# Patient Record
Sex: Male | Born: 1950
Health system: Southern US, Community
[De-identification: ages and names within clinical notes are randomized; demographics above are authoritative.]

## PROBLEM LIST (undated history)

## (undated) DIAGNOSIS — F419 Anxiety disorder, unspecified: Secondary | ICD-10-CM

## (undated) DIAGNOSIS — E119 Type 2 diabetes mellitus without complications: Secondary | ICD-10-CM

## (undated) DIAGNOSIS — C61 Malignant neoplasm of prostate: Secondary | ICD-10-CM

## (undated) HISTORY — PX: PROSTATE BIOPSY: SHX241

## (undated) HISTORY — DX: Type 2 diabetes mellitus without complications: E11.9

## (undated) HISTORY — DX: Anxiety disorder, unspecified: F41.9

---

## 1992-12-29 HISTORY — PX: KNEE RECONSTRUCTION: SHX5883

## 2017-06-03 DIAGNOSIS — Z0000 Encounter for general adult medical examination without abnormal findings: Secondary | ICD-10-CM | POA: Diagnosis not present

## 2017-07-13 ENCOUNTER — Ambulatory Visit (INDEPENDENT_AMBULATORY_CARE_PROVIDER_SITE_OTHER): Payer: PPO | Admitting: Physician Assistant

## 2017-07-13 ENCOUNTER — Encounter: Payer: Self-pay | Admitting: Physician Assistant

## 2017-07-13 VITALS — BP 134/83 | HR 80 | Temp 98.6°F | Resp 18 | Ht 71.0 in | Wt 225.0 lb

## 2017-07-13 DIAGNOSIS — I10 Essential (primary) hypertension: Secondary | ICD-10-CM

## 2017-07-13 DIAGNOSIS — Z794 Long term (current) use of insulin: Secondary | ICD-10-CM | POA: Diagnosis not present

## 2017-07-13 DIAGNOSIS — K209 Esophagitis, unspecified without bleeding: Secondary | ICD-10-CM

## 2017-07-13 DIAGNOSIS — N529 Male erectile dysfunction, unspecified: Secondary | ICD-10-CM

## 2017-07-13 DIAGNOSIS — E1142 Type 2 diabetes mellitus with diabetic polyneuropathy: Secondary | ICD-10-CM

## 2017-07-13 DIAGNOSIS — Z8719 Personal history of other diseases of the digestive system: Secondary | ICD-10-CM

## 2017-07-13 DIAGNOSIS — E1165 Type 2 diabetes mellitus with hyperglycemia: Secondary | ICD-10-CM

## 2017-07-13 DIAGNOSIS — IMO0002 Reserved for concepts with insufficient information to code with codable children: Secondary | ICD-10-CM

## 2017-07-13 DIAGNOSIS — E7800 Pure hypercholesterolemia, unspecified: Secondary | ICD-10-CM | POA: Diagnosis not present

## 2017-07-13 MED ORDER — LIRAGLUTIDE 18 MG/3ML ~~LOC~~ SOPN: 1.8000 mg | PEN_INJECTOR | Freq: Every day | SUBCUTANEOUS | 2 refills | Status: DC

## 2017-07-13 MED ORDER — OMEPRAZOLE 40 MG PO CPDR: 40.0000 mg | DELAYED_RELEASE_CAPSULE | Freq: Two times a day (BID) | ORAL | 4 refills | Status: DC

## 2017-07-13 MED ORDER — LISINOPRIL 10 MG PO TABS: 10.0000 mg | ORAL_TABLET | Freq: Every day | ORAL | 3 refills | Status: DC

## 2017-07-13 MED ORDER — METFORMIN HCL 1000 MG PO TABS: ORAL_TABLET | ORAL | 4 refills | Status: DC

## 2017-07-13 NOTE — Progress Notes (Signed)
Andrew Roberson  MRN: [id number] DOB: 08-Mar-1951  PCP: Mancel Bale, PA-C  Chief Complaint  Patient presents with  . Diabetes  . Medication Refill    needs a refill on lantus, glimepiride and lisinopril     Subjective:  Pt presents to clinic to establish care and to have a follow up for his DM. He feels fine.  He moved to [place] several years ago to help his aging parents whom have within the last 9 months both passed away.  He does not check his sugar.  He had noticed that since he has been in [place] his DM control has worsened he thinks due to diet changes and stress as well as he has been and still is less active.  He is interested in increased activity and he wants to start walking.  He was on [place] and it worked well but his insurance changed and it would not cover it.  He has not been checking his glucose as home.  He takes his medications as directed.  He has had problems with erections in the past and continues to have those problems.  He has never had his testosterone checked and he has tried cialis and viagra without results.  2/18 - lab results A1C in 7.9 HDL 43 LDL 40  Review of Systems  Constitutional: Negative for chills and fever.  Respiratory: Negative for cough and shortness of breath.   Cardiovascular: Negative for chest pain, palpitations and leg swelling.  Genitourinary:       Impotence - trouble getting and maintaining erections  Neurological: Negative for numbness.    Patient Active Problem List   Diagnosis Date Noted  . Diabetes type 2, uncontrolled ([place]) 07/13/2017  . HTN (hypertension) 07/13/2017  . Elevated cholesterol 07/13/2017  . Esophagitis 07/13/2017  . History of esophageal stricture 07/13/2017    No current outpatient prescriptions on file prior to visit.   No current facility-administered medications on file prior to visit.     Allergies  Allergen Reactions  . Codeine     Other reaction(s): Abdominal Pain    Past Medical History:    Diagnosis Date  . Anxiety   . Diabetes mellitus without complication [facility])    Social History   Social History  . Marital status: Single    Spouse name: N/A  . Number of children: N/A  . Years of education: N/A   Occupational History  . retired    Social History Main Topics  . Smoking status: Former Smoker    Packs/day: 1.00    Years: 25.00    Quit date: 07/14/2007  . Smokeless tobacco: Never Used  . Alcohol use No     Comment: social rare  . Drug use: Yes    Types: Marijuana     Comment: social  . Sexual activity: No   Other Topics Concern  . 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   family history includes Dementia in his mother; Berenice Primas' disease in his sister; Hypertension in his father; Myelodysplastic syndrome in his father; Stroke in his mother.     Objective:  BP 134/83   Pulse 80   Temp 98.6 F (37 C) (Oral)   Resp 18   Ht _0  (1.803 m)   Wt 225 lb (102.1 kg)   SpO2 95%   BMI 31.38 kg/m   Physical Exam  Constitutional: He is oriented to person, place, and time and well-developed, well-nourished, and in no distress.  HENT:  Head: Normocephalic and atraumatic.  Right Ear: External ear normal.  Left Ear: External ear normal.  Eyes: Conjunctivae are normal.  Neck: Normal range of motion.  Cardiovascular: Normal rate, regular rhythm and normal heart sounds.   Pulmonary/Chest: Effort normal and breath sounds normal.  Neurological: He is alert and oriented to person, place, and time. Gait normal.  Skin: Skin is warm and dry.  Diabetic Foot Exam - Simple   Simple Foot Form Visual Inspection See comments:  Yes Sensation Testing Intact to touch and monofilament testing bilaterally:  Yes Pulse Check Posterior Tibialis and Dorsalis pulse intact bilaterally:  Yes Comments Has a cut on top right foot that is healing well  Psychiatric: Mood, memory, affect and  judgment normal.   Assessment and Plan :  Impotence - Plan: TestT+TestF+SHBG  Uncontrolled type 2 diabetes mellitus with diabetic polyneuropathy, with long-term current use of insulin (HCC) - Plan: CMP14+EGFR, Hemoglobin A1c, metFORMIN (GLUCOPHAGE) 1000 MG tablet, liraglutide 18 MG/3ML SOPN, HM DIABETES FOOT EXAM  Essential hypertension - Plan: lisinopril (PRINIVIL,ZESTRIL) 10 MG tablet  Elevated cholesterol - Plan: Lipid panel  Esophagitis - Plan: omeprazole (PRILOSEC) 40 MG capsule  History of esophageal stricture  Windell Hummingbird PA-C  Primary Care at [place] 07/14/2017 7:37 AM

## 2017-07-13 NOTE — Patient Instructions (Addendum)
     IF you received an x-ray today, you will receive an invoice from [facility]. Please contact [facility] at [phone] with questions or concerns regarding your invoice.   IF you received labwork today, you will receive an invoice from [place]. Please contact [facility] at [phone] with questions or concerns regarding your invoice.   Our billing staff will not be able to assist you with questions regarding bills from these companies.  You will be contacted with the lab results as soon as they are available. The fastest way to get your results is to activate your My Chart account. Instructions are located on the last page of this paperwork. If you have not heard from [place] regarding the results in 2 weeks, please contact this office.    Please sign up for mychart as it is a great way to get your labs and communicate with me.

## 2017-07-14 LAB — CMP14+EGFR
ALT: 16 IU/L (ref 0–44)
AST: 13 IU/L (ref 0–40)
Albumin/Globulin Ratio: 1.8 (ref 1.2–2.2)
Albumin: 4.6 g/dL (ref 3.6–4.8)
Alkaline Phosphatase: 96 IU/L (ref 39–117)
BUN/Creatinine Ratio: 16 (ref 10–24)
BUN: 16 mg/dL (ref 8–27)
Bilirubin Total: 0.3 mg/dL (ref 0.0–1.2)
CO2: 21 mmol/L (ref 20–29)
Calcium: 10.4 mg/dL — ABNORMAL HIGH (ref 8.6–10.2)
Chloride: 95 mmol/L — ABNORMAL LOW (ref 96–106)
Creatinine, Ser: 1 mg/dL (ref 0.76–1.27)
GFR calc Af Amer: 91 mL/min/{1.73_m2} (ref >59)
GFR calc non Af Amer: 79 mL/min/{1.73_m2} (ref >59)
Globulin, Total: 2.5 g/dL (ref 1.5–4.5)
Glucose: 160 mg/dL — ABNORMAL HIGH (ref 65–99)
Potassium: 5 mmol/L (ref 3.5–5.2)
Sodium: 136 mmol/L (ref 134–144)
Total Protein: 7.1 g/dL (ref 6.0–8.5)

## 2017-07-14 LAB — LIPID PANEL
Chol/HDL Ratio: 3.1 ratio (ref 0.0–5.0)
Cholesterol, Total: 147 mg/dL (ref 100–199)
HDL: 47 mg/dL (ref >39)
LDL Calculated: 69 mg/dL (ref 0–99)
Triglycerides: 153 mg/dL — ABNORMAL HIGH (ref 0–149)
VLDL Cholesterol Cal: 31 mg/dL (ref 5–40)

## 2017-07-14 LAB — HEMOGLOBIN A1C
Est. average glucose Bld gHb Est-mCnc: 212 mg/dL
Hgb A1c MFr Bld: 9 % — ABNORMAL HIGH (ref 4.8–5.6)

## 2017-07-17 ENCOUNTER — Encounter: Payer: Self-pay | Admitting: Physician Assistant

## 2017-07-20 DIAGNOSIS — Z85828 Personal history of other malignant neoplasm of skin: Secondary | ICD-10-CM | POA: Diagnosis not present

## 2017-07-20 DIAGNOSIS — D1801 Hemangioma of skin and subcutaneous tissue: Secondary | ICD-10-CM | POA: Diagnosis not present

## 2017-07-20 DIAGNOSIS — D2271 Melanocytic nevi of right lower limb, including hip: Secondary | ICD-10-CM | POA: Diagnosis not present

## 2017-07-20 DIAGNOSIS — D2262 Melanocytic nevi of left upper limb, including shoulder: Secondary | ICD-10-CM | POA: Diagnosis not present

## 2017-07-20 DIAGNOSIS — D2261 Melanocytic nevi of right upper limb, including shoulder: Secondary | ICD-10-CM | POA: Diagnosis not present

## 2017-07-20 DIAGNOSIS — L821 Other seborrheic keratosis: Secondary | ICD-10-CM | POA: Diagnosis not present

## 2017-07-21 LAB — TESTT+TESTF+SHBG
Sex Hormone Binding: 40.2 nmol/L (ref 19.3–76.4)
Testosterone, Free: 9.4 pg/mL (ref 6.6–18.1)
Testosterone, total: 530.5 ng/dL (ref 264.0–916.0)

## 2017-09-18 ENCOUNTER — Ambulatory Visit (INDEPENDENT_AMBULATORY_CARE_PROVIDER_SITE_OTHER): Payer: PPO | Admitting: Physician Assistant

## 2017-09-18 ENCOUNTER — Encounter: Payer: Self-pay | Admitting: Physician Assistant

## 2017-09-18 VITALS — BP 113/76 | HR 80 | Temp 98.3°F | Resp 18 | Ht 70.71 in | Wt 217.6 lb

## 2017-09-18 DIAGNOSIS — Z23 Encounter for immunization: Secondary | ICD-10-CM | POA: Diagnosis not present

## 2017-09-18 DIAGNOSIS — S61112A Laceration without foreign body of left thumb with damage to nail, initial encounter: Secondary | ICD-10-CM

## 2017-09-18 NOTE — Patient Instructions (Addendum)
WOUND CARE Please return in 10-14 days to have your stitches/staples removed or sooner if you have concerns. Marland Kitchen Keep area clean and dry for 24 hours. Do not remove bandage, if applied. . After 24 hours, remove bandage and wash wound gently with mild soap and warm water. Reapply a new bandage after cleaning wound, if directed. . Continue daily cleansing with soap and water until stitches/staples are removed. . Do not apply any ointments or creams to the wound while stitches/staples are in place, as this may cause delayed healing. . Notify the office if you experience any of the following signs of infection: Swelling, redness, pus drainage, streaking, fever >101.0 F . Notify the office if you experience excessive bleeding that does not stop after 15-20 minutes of constant, firm pressure.     IF you received an x-ray today, you will receive an invoice from [facility]. Please contact [facility] at [phone] with questions or concerns regarding your invoice.   IF you received labwork today, you will receive an invoice from [place]. Please contact [facility] at [phone] with questions or concerns regarding your invoice.   Our billing staff will not be able to assist you with questions regarding bills from these companies.  You will be contacted with the lab results as soon as they are available. The fastest way to get your results is to activate your My Chart account. Instructions are located on the last page of this paperwork. If you have not heard from [place] regarding the results in 2 weeks, please contact this office.

## 2017-09-18 NOTE — Progress Notes (Signed)
Andrew Roberson  MRN: [id number] DOB: 1951/07/24  Subjective:  Andrew Roberson is a 66 y.o. male seen in office today for a chief complaint of cut to left tip of thumb x 1 hour prior to arrival. He was cutting food with a clean knife and it slipped and cut his thumb. He cleaned it out immediately. He had associated bleeding and pain. Denies numbness, tingling, and decreased ROM. He cannot recall if he is up to date on tdap vaccine.   Review of Systems  Constitutional: Negative for chills, diaphoresis and fever.  Gastrointestinal: Negative for nausea and vomiting.  Neurological: Negative for dizziness and light-headedness.    Patient Active Problem List   Diagnosis Date Noted  . Diabetes type 2, uncontrolled ([place]) 07/13/2017  . HTN (hypertension) 07/13/2017  . Elevated cholesterol 07/13/2017  . Esophagitis 07/13/2017  . History of esophageal stricture 07/13/2017    Current Outpatient Prescriptions on File Prior to Visit  Medication Sig Dispense Refill  . ALPRAZolam (XANAX) 0.25 MG tablet TAKE 1 TABLET BY MOUTH AT BEDTIME AS NEEDED FOR SLEEP    . aspirin 81 MG chewable tablet Chew by mouth.    . Cyanocobalamin (B-12) 2500 MCG TABS Take by mouth.    . Insulin Glargine (LANTUS) 100 UNIT/ML Solostar Pen INJECT 20 UNITS SUBCUTANEOUSLY EVERY DAY    . liraglutide 18 MG/3ML SOPN Inject 0.3 mLs (1.8 mg total) into the skin daily. 3 mL 2  . lisinopril (PRINIVIL,ZESTRIL) 10 MG tablet Take 1 tablet (10 mg total) by mouth daily. 90 tablet 3  . metFORMIN (GLUCOPHAGE) 1000 MG tablet 1 po bid 180 tablet 4  . Multiple Vitamin (THERA) TABS Take by mouth.    Marland Kitchen omeprazole (PRILOSEC) 40 MG capsule Take 1 capsule (40 mg total) by mouth 2 (two) times daily. 180 capsule 4   No current facility-administered medications on file prior to visit.     Allergies  Allergen Reactions  . Codeine     Other reaction(s): Abdominal Pain     Objective:  BP 113/76 (BP Location: Right Arm, Patient Position: Sitting,  Cuff Size: Large)   Pulse 80   Temp 98.3 F (36.8 C) (Oral)   Resp 18   Ht 5' 10.71" (1.796 m)   Wt 217 lb 9.6 oz (98.7 kg)   SpO2 96%   BMI 30.60 kg/m   Physical Exam  Constitutional: He is oriented to person, place, and time and well-developed, well-nourished, and in no distress.  HENT:  Head: Normocephalic and atraumatic.  Eyes: Conjunctivae are normal.  Neck: Normal range of motion.  Pulmonary/Chest: Effort normal.  Musculoskeletal:       Left hand: He exhibits laceration. He exhibits normal range of motion and normal capillary refill. Normal sensation noted. Normal strength noted.       Hands: Neurological: He is alert and oriented to person, place, and time. Gait normal.  Skin: Skin is warm and dry.  Psychiatric: Affect normal.  Vitals reviewed.   PROCEDURE NOTE: laceration repair Verbal consent obtained from patient.  Local anesthesia with 2cc Lidocaine 1% without epinephrine.  Wound explored for tendon, ligament damage. Wound scrubbed with soap and water and rinsed. Wound closed with #4 4-0 Ethilon simple interrupted sutures.  Wound cleansed and dressed.    Assessment and Plan :  1. Laceration of left thumb without foreign body with damage to nail, initial encounter Wound care instructions given to patient. Return in 10-14 days for suture removal or sooner if signs of infection  develop.  - Tdap vaccine greater than or equal to 7yo   2. Need for immunization against influenza - Flu Vaccine QUAD 36+ mos IM  Tenna Delaine PA-C  Primary Care at [facility] 09/18/2017 4:15 PM

## 2017-09-21 ENCOUNTER — Ambulatory Visit: Payer: PPO | Admitting: Family Medicine

## 2017-09-30 ENCOUNTER — Ambulatory Visit (INDEPENDENT_AMBULATORY_CARE_PROVIDER_SITE_OTHER): Payer: PPO | Admitting: Physician Assistant

## 2017-09-30 ENCOUNTER — Encounter: Payer: Self-pay | Admitting: Physician Assistant

## 2017-09-30 VITALS — BP 114/64 | HR 97 | Temp 98.3°F | Resp 18 | Ht 70.95 in | Wt 213.0 lb

## 2017-09-30 DIAGNOSIS — S61112D Laceration without foreign body of left thumb with damage to nail, subsequent encounter: Secondary | ICD-10-CM

## 2017-09-30 NOTE — Progress Notes (Signed)
   Patient: Andrew Roberson [id number]  Subjective: Andrew Roberson is returning for suture removal. Patient was initially seen 09/18/17 and had #4 sutures placed. Denies fever, drainage of pus or blood, wound dehiscence, edema, pain.   Objective: Physical Exam  Constitutional: He is oriented to person, place, and time and well-developed, well-nourished, and in no distress.  HENT:  Head: Normocephalic and atraumatic.  Eyes: Conjunctivae are normal.  Neck: Normal range of motion.  Pulmonary/Chest: Effort normal.  Neurological: He is alert and oriented to person, place, and time. Gait normal.  Skin: Skin is warm and dry.  Well healed laceration to most distal palmar aspect of left thumb. Sutures intact. Multiple small hematomas noted underneath skin near suture lines. No surrounding erythema, warmth, or purulent drainage.   Psychiatric: Affect normal.  Vitals reviewed.  #4 sutures removed without incident. Patient tolerated this well.  Assessment and Plan: 1. Laceration of left thumb without foreign body with damage to nail, subsequent encounter Well-healed wound. Anticipatory guidance provided. Return to clinic as needed.  Tenna Delaine, PA-C  [facility] at [place] [facility] 09/30/2017 3:55 PM

## 2017-09-30 NOTE — Patient Instructions (Addendum)
  I recommend keeping the area covered for the next 4-5 days and continue light cleansing with soap and water. Thank you for letting me participate in your health and well being.   IF you received an x-ray today, you will receive an invoice from [facility]. Please contact [facility] at [phone] with questions or concerns regarding your invoice.   IF you received labwork today, you will receive an invoice from [place]. Please contact [facility] at [phone] with questions or concerns regarding your invoice.   Our billing staff will not be able to assist you with questions regarding bills from these companies.  You will be contacted with the lab results as soon as they are available. The fastest way to get your results is to activate your My Chart account. Instructions are located on the last page of this paperwork. If you have not heard from [place] regarding the results in 2 weeks, please contact this office.

## 2017-10-02 ENCOUNTER — Other Ambulatory Visit: Payer: Self-pay

## 2017-10-02 DIAGNOSIS — E1165 Type 2 diabetes mellitus with hyperglycemia: Secondary | ICD-10-CM

## 2017-10-02 DIAGNOSIS — E1142 Type 2 diabetes mellitus with diabetic polyneuropathy: Secondary | ICD-10-CM

## 2017-10-02 DIAGNOSIS — Z794 Long term (current) use of insulin: Principal | ICD-10-CM

## 2017-10-02 DIAGNOSIS — IMO0002 Reserved for concepts with insufficient information to code with codable children: Secondary | ICD-10-CM

## 2017-10-02 MED ORDER — LIRAGLUTIDE 18 MG/3ML ~~LOC~~ SOPN: 1.8000 mg | PEN_INJECTOR | Freq: Every day | SUBCUTANEOUS | 0 refills | Status: DC

## 2017-10-13 ENCOUNTER — Encounter: Payer: Self-pay | Admitting: Physician Assistant

## 2017-10-13 ENCOUNTER — Ambulatory Visit (INDEPENDENT_AMBULATORY_CARE_PROVIDER_SITE_OTHER): Payer: PPO | Admitting: Physician Assistant

## 2017-10-13 VITALS — BP 134/72 | HR 87 | Temp 98.3°F | Resp 18 | Ht 70.95 in | Wt 215.6 lb

## 2017-10-13 DIAGNOSIS — E1165 Type 2 diabetes mellitus with hyperglycemia: Secondary | ICD-10-CM

## 2017-10-13 DIAGNOSIS — Z23 Encounter for immunization: Secondary | ICD-10-CM

## 2017-10-13 DIAGNOSIS — I10 Essential (primary) hypertension: Secondary | ICD-10-CM

## 2017-10-13 DIAGNOSIS — E7800 Pure hypercholesterolemia, unspecified: Secondary | ICD-10-CM | POA: Diagnosis not present

## 2017-10-13 MED ORDER — ZOSTER VAC RECOMB ADJUVANTED 50 MCG/0.5ML IM SUSR: 0.5000 mL | Freq: Once | INTRAMUSCULAR | 0 refills | Status: AC

## 2017-10-13 NOTE — Progress Notes (Signed)
Andrew Roberson  MRN: [id number] DOB: Aug 24, 1951  PCP: Mancel Bale, PA-C  Chief Complaint  Patient presents with  . Diabetes    check up    Subjective:  Pt presents to clinic for medication refill and to recheck his finger from the laceration he had last month.    Glucose - in the 90s - high is in the 130s but he always knows why it is that high based on diet the previous day. - he takes his medications as directed - he did get some nausea with victozia was it has resolved.  He still has a large scab on the healed wound on his finger and he wants it checked to make sure it is ok.  Intermittent burning across toes once in a while but not recently worse.  History is obtained by patient.  Review of Systems  Constitutional: Negative for chills and fever.  Eyes: Negative for visual disturbance.  Respiratory: Negative for cough and shortness of breath.   Cardiovascular: Negative for chest pain, palpitations and leg swelling.  Skin: Positive for wound.  Neurological: Negative for dizziness, light-headedness, numbness and headaches.    Patient Active Problem List   Diagnosis Date Noted  . Diabetes type 2, uncontrolled ([place]) 07/13/2017  . HTN (hypertension) 07/13/2017  . Elevated cholesterol 07/13/2017  . Esophagitis 07/13/2017  . History of esophageal stricture 07/13/2017    Current Outpatient Prescriptions on File Prior to Visit  Medication Sig Dispense Refill  . ALPRAZolam (XANAX) 0.25 MG tablet TAKE 1 TABLET BY MOUTH AT BEDTIME AS NEEDED FOR SLEEP    . aspirin 81 MG chewable tablet Chew by mouth.    . Cyanocobalamin (B-12) 2500 MCG TABS Take by mouth.    . Insulin Glargine (LANTUS) 100 UNIT/ML Solostar Pen INJECT 20 UNITS SUBCUTANEOUSLY EVERY DAY    . liraglutide 18 MG/3ML SOPN Inject 0.3 mLs (1.8 mg total) into the skin daily. 9 mL 0  . lisinopril (PRINIVIL,ZESTRIL) 10 MG tablet Take 1 tablet (10 mg total) by mouth daily. 90 tablet 3  . metFORMIN (GLUCOPHAGE) 1000 MG  tablet 1 po bid 180 tablet 4  . Multiple Vitamin (THERA) TABS Take by mouth.    Marland Kitchen omeprazole (PRILOSEC) 40 MG capsule Take 1 capsule (40 mg total) by mouth 2 (two) times daily. 180 capsule 4   No current facility-administered medications on file prior to visit.     Allergies  Allergen Reactions  . Codeine     Other reaction(s): Abdominal Pain    Past Medical History:  Diagnosis Date  . Anxiety   . 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   Social History  Substance Use Topics  . Smoking status: Former Smoker    Packs/day: 1.00    Years: 25.00    Quit date: 07/14/2007  . Smokeless tobacco: Never Used  . Alcohol use No     Comment: social rare   family history includes Dementia in his mother; Berenice Primas' disease in his sister; Hypertension in his father; Myelodysplastic syndrome in his father; Stroke in his mother.     Objective:  BP 134/72   Pulse 87   Temp 98.3 F (36.8 C) (Oral)   Resp 18   Ht 5' 10.95" (1.802 m)   Wt 215 lb 9.6 oz (97.8 kg)   SpO2 97%  BMI 30.11 kg/m  Body mass index is 30.11 kg/m.  Physical Exam  Constitutional: He is oriented to person, place, and time and well-developed, well-nourished, and in no distress.  HENT:  Head: Normocephalic and atraumatic.  Right Ear: External ear normal.  Left Ear: External ear normal.  Eyes: Conjunctivae are normal.  Neck: Normal range of motion.  Cardiovascular: Normal rate, regular rhythm, normal heart sounds and intact distal pulses.   Pulmonary/Chest: Effort normal and breath sounds normal. He has no wheezes.  Musculoskeletal:       Right lower leg: He exhibits no edema.       Left lower leg: He exhibits no edema.  Neurological: He is alert and oriented to person, place, and time. Gait normal.  Skin: Skin is warm and dry.  Left thumb scab over wound removed without  problems - healed skin underneath  Psychiatric: Mood, memory, affect and judgment normal.    Assessment and Plan :  Essential hypertension - controlled on medications  Uncontrolled type 2 diabetes mellitus with hyperglycemia ([place]) - Plan: Hemoglobin A1c, CMP14+EGFR, Microalbumin, urine - check labs   Elevated cholesterol - check labs - he is on statin and tolerating it well - in the past he has been on a higher dose and when his dose was decreased his cholesterol decreased - he has been stable on this dose for a few years.  Need for pneumococcal vaccination - Plan: Pneumococcal conjugate vaccine 13-valent IM - vaccines updated  Need for shingles vaccine - Plan: Zoster Vaccine Adjuvanted [facility]) injection - vaccines updated  Pt plans to get eye exam and have the results sent here.  Windell Hummingbird PA-C  Primary [facility] at [place] [facility] 10/16/2017 3:47 PM

## 2017-10-13 NOTE — Patient Instructions (Signed)
     IF you received an x-ray today, you will receive an invoice from [facility]. Please contact [facility] at [phone] with questions or concerns regarding your invoice.   IF you received labwork today, you will receive an invoice from [facility]. Please contact [facility] at [phone] with questions or concerns regarding your invoice.   Our billing staff will not be able to assist you with questions regarding bills from these companies.  You will be contacted with the lab results as soon as they are available. The fastest way to get your results is to activate your My Chart account. Instructions are located on the last page of this paperwork. If you have not heard from us regarding the results in 2 weeks, please contact this office.     

## 2017-10-14 LAB — CMP14+EGFR
ALT: 18 IU/L (ref 0–44)
AST: 13 IU/L (ref 0–40)
Albumin/Globulin Ratio: 2 (ref 1.2–2.2)
Albumin: 4.6 g/dL (ref 3.6–4.8)
Alkaline Phosphatase: 97 IU/L (ref 39–117)
BUN/Creatinine Ratio: 12 (ref 10–24)
BUN: 10 mg/dL (ref 8–27)
Bilirubin Total: 0.4 mg/dL (ref 0.0–1.2)
CO2: 23 mmol/L (ref 20–29)
Calcium: 9.7 mg/dL (ref 8.6–10.2)
Chloride: 99 mmol/L (ref 96–106)
Creatinine, Ser: 0.81 mg/dL (ref 0.76–1.27)
GFR calc Af Amer: 108 mL/min/{1.73_m2} (ref >59)
GFR calc non Af Amer: 93 mL/min/{1.73_m2} (ref >59)
Globulin, Total: 2.3 g/dL (ref 1.5–4.5)
Glucose: 126 mg/dL — ABNORMAL HIGH (ref 65–99)
Potassium: 4.8 mmol/L (ref 3.5–5.2)
Sodium: 140 mmol/L (ref 134–144)
Total Protein: 6.9 g/dL (ref 6.0–8.5)

## 2017-10-14 LAB — HEMOGLOBIN A1C
Est. average glucose Bld gHb Est-mCnc: 166 mg/dL
Hgb A1c MFr Bld: 7.4 % — ABNORMAL HIGH (ref 4.8–5.6)

## 2017-10-14 LAB — MICROALBUMIN, URINE: Microalbumin, Urine: 23.5 ug/mL

## 2017-10-16 ENCOUNTER — Encounter: Payer: Self-pay | Admitting: Physician Assistant

## 2017-10-28 ENCOUNTER — Encounter: Payer: Self-pay | Admitting: Physician Assistant

## 2017-10-28 ENCOUNTER — Other Ambulatory Visit: Payer: Self-pay | Admitting: Physician Assistant

## 2017-10-28 DIAGNOSIS — E1142 Type 2 diabetes mellitus with diabetic polyneuropathy: Secondary | ICD-10-CM

## 2017-10-28 DIAGNOSIS — E1165 Type 2 diabetes mellitus with hyperglycemia: Principal | ICD-10-CM

## 2017-10-28 DIAGNOSIS — IMO0002 Reserved for concepts with insufficient information to code with codable children: Secondary | ICD-10-CM

## 2017-10-28 DIAGNOSIS — Z794 Long term (current) use of insulin: Principal | ICD-10-CM

## 2017-10-28 MED ORDER — LIRAGLUTIDE 18 MG/3ML ~~LOC~~ SOPN: 1.8000 mg | PEN_INJECTOR | Freq: Every day | SUBCUTANEOUS | 0 refills | Status: DC

## 2017-11-11 ENCOUNTER — Encounter: Payer: Self-pay | Admitting: Physician Assistant

## 2017-11-24 NOTE — Telephone Encounter (Signed)
Patient notified via My Chart

## 2017-12-09 ENCOUNTER — Encounter: Payer: Self-pay | Admitting: Physician Assistant

## 2017-12-10 ENCOUNTER — Other Ambulatory Visit: Payer: Self-pay

## 2017-12-10 MED ORDER — ROSUVASTATIN CALCIUM 5 MG PO TABS: 5.0000 mg | ORAL_TABLET | Freq: Every day | ORAL | 0 refills | Status: DC

## 2017-12-29 ENCOUNTER — Other Ambulatory Visit: Payer: Self-pay | Admitting: Physician Assistant

## 2017-12-29 ENCOUNTER — Encounter: Payer: Self-pay | Admitting: Physician Assistant

## 2017-12-29 DIAGNOSIS — E1142 Type 2 diabetes mellitus with diabetic polyneuropathy: Secondary | ICD-10-CM

## 2017-12-29 DIAGNOSIS — E1165 Type 2 diabetes mellitus with hyperglycemia: Principal | ICD-10-CM

## 2017-12-29 DIAGNOSIS — IMO0002 Reserved for concepts with insufficient information to code with codable children: Secondary | ICD-10-CM

## 2017-12-29 DIAGNOSIS — Z794 Long term (current) use of insulin: Principal | ICD-10-CM

## 2017-12-31 MED ORDER — INSULIN GLARGINE 100 UNIT/ML SOLOSTAR PEN: PEN_INJECTOR | SUBCUTANEOUS | 3 refills | Status: DC

## 2017-12-31 MED ORDER — LIRAGLUTIDE 18 MG/3ML ~~LOC~~ SOPN: 1.8000 mg | PEN_INJECTOR | Freq: Every day | SUBCUTANEOUS | 0 refills | Status: DC

## 2018-01-19 ENCOUNTER — Encounter: Payer: PPO | Admitting: Physician Assistant

## 2018-01-26 ENCOUNTER — Ambulatory Visit (INDEPENDENT_AMBULATORY_CARE_PROVIDER_SITE_OTHER): Payer: PPO | Admitting: Physician Assistant

## 2018-01-26 ENCOUNTER — Other Ambulatory Visit: Payer: Self-pay

## 2018-01-26 ENCOUNTER — Encounter: Payer: Self-pay | Admitting: Physician Assistant

## 2018-01-26 VITALS — BP 102/60 | HR 95 | Temp 98.8°F | Resp 18 | Ht 70.0 in | Wt 212.2 lb

## 2018-01-26 DIAGNOSIS — R351 Nocturia: Secondary | ICD-10-CM

## 2018-01-26 DIAGNOSIS — N401 Enlarged prostate with lower urinary tract symptoms: Secondary | ICD-10-CM

## 2018-01-26 DIAGNOSIS — Z125 Encounter for screening for malignant neoplasm of prostate: Secondary | ICD-10-CM | POA: Diagnosis not present

## 2018-01-26 DIAGNOSIS — Z87891 Personal history of nicotine dependence: Secondary | ICD-10-CM

## 2018-01-26 DIAGNOSIS — F4321 Adjustment disorder with depressed mood: Secondary | ICD-10-CM

## 2018-01-26 DIAGNOSIS — E7800 Pure hypercholesterolemia, unspecified: Secondary | ICD-10-CM | POA: Diagnosis not present

## 2018-01-26 DIAGNOSIS — Z136 Encounter for screening for cardiovascular disorders: Secondary | ICD-10-CM | POA: Diagnosis not present

## 2018-01-26 DIAGNOSIS — K209 Esophagitis, unspecified without bleeding: Secondary | ICD-10-CM

## 2018-01-26 DIAGNOSIS — Z794 Long term (current) use of insulin: Secondary | ICD-10-CM

## 2018-01-26 DIAGNOSIS — E1142 Type 2 diabetes mellitus with diabetic polyneuropathy: Secondary | ICD-10-CM

## 2018-01-26 DIAGNOSIS — Z0000 Encounter for general adult medical examination without abnormal findings: Secondary | ICD-10-CM | POA: Diagnosis not present

## 2018-01-26 DIAGNOSIS — I10 Essential (primary) hypertension: Secondary | ICD-10-CM | POA: Diagnosis not present

## 2018-01-26 DIAGNOSIS — E1165 Type 2 diabetes mellitus with hyperglycemia: Secondary | ICD-10-CM | POA: Diagnosis not present

## 2018-01-26 DIAGNOSIS — R937 Abnormal findings on diagnostic imaging of other parts of musculoskeletal system: Secondary | ICD-10-CM

## 2018-01-26 DIAGNOSIS — IMO0002 Reserved for concepts with insufficient information to code with codable children: Secondary | ICD-10-CM

## 2018-01-26 MED ORDER — LIRAGLUTIDE 18 MG/3ML ~~LOC~~ SOPN: 1.8000 mg | PEN_INJECTOR | Freq: Every day | SUBCUTANEOUS | 0 refills | Status: DC

## 2018-01-26 MED ORDER — INSULIN GLARGINE 100 UNIT/ML SOLOSTAR PEN: PEN_INJECTOR | SUBCUTANEOUS | 3 refills | Status: DC

## 2018-01-26 MED ORDER — TAMSULOSIN HCL 0.4 MG PO CAPS: 0.4000 mg | ORAL_CAPSULE | Freq: Every day | ORAL | 0 refills | Status: DC

## 2018-01-26 MED ORDER — ROSUVASTATIN CALCIUM 5 MG PO TABS: 5.0000 mg | ORAL_TABLET | Freq: Every day | ORAL | 0 refills | Status: DC

## 2018-01-26 MED ORDER — OMEPRAZOLE 40 MG PO CPDR: 40.0000 mg | DELAYED_RELEASE_CAPSULE | Freq: Every day | ORAL | 4 refills | Status: DC

## 2018-01-26 MED ORDER — ALPRAZOLAM 0.25 MG PO TABS: 0.2500 mg | ORAL_TABLET | Freq: Every evening | ORAL | 0 refills | Status: DC | PRN

## 2018-01-26 NOTE — Progress Notes (Signed)
Presents today for Medicare Visit.   Interpreter used for this visit? no  Other items to address today:  DM - rationed his lantus (87) and victoza (1.2) reent death of sister with some depression - increase stress with her immediate family and his parents house sale   Cancer Screening:  Colon (every 10 years - ages 66-75): 2012 - no polyps Prostate (annually - ages > 37):  2017 - 1.6 PSA  Other screening: Bone density screening (every 2 years - ages >59): will order [place] for AAA (males 24-75 - smoking history - once): will order ETOH use: no Dental visits: every 6 months Vision visits: wears glasses - tomorrow Typical Meals: 2-3 meals - snacking in between trying to decrease his carbs and increase his proteins and fruits Typical Beverages: diet dr pepper - rare water Exercise: trying to walk - wanting to get back to gym  Lab Screening: Last screening for diabetes (annually): current diagnosis Last lipid screening (every 5 years): h/o elevated cholesterol  ADVANCE DIRECTIVES: Discussed: no On File: no Materials Provided: yes Patient desires CPR (nut sure), mechanical ventilation (No ), prolonged artificial support (may include mechanical ventilation, tube/PEG feeding, etc) (No ).  Depression screen [facility] 2/9 01/26/2018 10/13/2017 09/30/2017 09/18/2017 07/13/2017  Decreased Interest 1 0 0 0 3  Down, Depressed, Hopeless 1 0 0 0 3  PHQ - 2 Score 2 0 0 0 6  Altered sleeping 1 - - - 0  Tired, decreased energy 1 - - - 3  Change in appetite 0 - - - 1  Feeling bad or failure about yourself  0 - - - 0  Trouble concentrating 0 - - - 0  Moving slowly or fidgety/restless 0 - - - 0  Suicidal thoughts 0 - - - 0  PHQ-9 Score 4 - - - 10     Functional Status Survey: Is the patient deaf or have difficulty hearing?: No Does the patient have difficulty seeing, even when wearing glasses/contacts?: Yes Does the patient have difficulty concentrating, remembering, or making decisions?:  No Does the patient have difficulty walking or climbing stairs?: No Does the patient have difficulty dressing or bathing?: No Does the patient have difficulty doing errands alone such as visiting a doctor's office or shopping?: No   Fall Risk  01/26/2018 10/13/2017 09/30/2017 09/18/2017 07/13/2017  Falls in the past year? No No No No No    ADL- no problems Home safety- no problems   Immunization status:  Immunization History  Administered Date(s) Administered  . Influenza,inj,Quad PF,6+ Mos 09/18/2017  . Pneumococcal Conjugate-13 10/13/2017  . Tdap 09/18/2017    Health Maintenance Due  Topic Date Due  . OPHTHALMOLOGY EXAM  05/01/1961    Patient Care Team: Mittie Bodo as PCP - General (Physician Assistant)   Patient Active Problem List   Diagnosis Date Noted  . Uncontrolled type 2 diabetes mellitus with diabetic polyneuropathy, with long-term current use of insulin ([place]) 07/13/2017  . HTN (hypertension) 07/13/2017  . Elevated cholesterol 07/13/2017  . Esophagitis 07/13/2017  . History of esophageal stricture 07/13/2017     Past Medical History:  Diagnosis Date  . Anxiety   . Diabetes mellitus without complication [facility])      Past Surgical History:  Procedure Laterality Date  . KNEE RECONSTRUCTION Left 1994     Family History  Problem Relation Age of Onset  . Stroke Mother   . Dementia Mother   . Myelodysplastic syndrome Father   .  Hypertension Father   . Graves' disease Sister      Social History   Socioeconomic History  . Marital status: Single    Spouse name: Not on file  . Number of children: Not on file  . Years of education: Not on file  . Highest education level: Not on file  Social Needs  . Financial resource strain: Not on file  . Food insecurity - worry: Not on file  . Food insecurity - inability: Not on file  . Transportation needs - medical: Not on file  . Transportation needs - non-medical: Not on file  Occupational History   . Occupation: retired  Tobacco Use  . Smoking status: Former Smoker    Packs/day: 1.00    Years: 25.00    Pack years: 25.00    Last attempt to quit: 07/14/2007    Years since quitting: 10.5  . Smokeless tobacco: Never Used  Substance and Sexual Activity  . Alcohol use: No    Comment: social rare  . Drug use: Yes    Types: Marijuana    Comment: social  . Sexual activity: No  Other Topics Concern  . 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     Allergies  Allergen Reactions  . Codeine     Other reaction(s): Abdominal Pain    Prior to Admission medications   Medication Sig Start Date End Date Taking? Authorizing Provider  ALPRAZolam (XANAX) 0.25 MG tablet TAKE 1 TABLET BY MOUTH AT BEDTIME AS NEEDED FOR SLEEP 12/25/16  Yes [provider]  aspirin 81 MG chewable tablet Chew by mouth.   Yes [provider]  Cyanocobalamin (B-12) 2500 MCG TABS Take by mouth.   Yes [provider]  Insulin Glargine (LANTUS) 100 UNIT/ML Solostar Pen INJECT 20 UNITS SUBCUTANEOUSLY EVERY DAY 12/31/17  Yes Jeffery, Chelle, PA-C  liraglutide (VICTOZA) 18 MG/3ML SOPN Inject 0.3 mLs (1.8 mg total) into the skin daily. 12/31/17  Yes Toriann Spadoni L, PA-C  lisinopril (PRINIVIL,ZESTRIL) 10 MG tablet Take 1 tablet (10 mg total) by mouth daily. 07/13/17  Yes Brentton Wardlow, Damaris Hippo, PA-C  metFORMIN (GLUCOPHAGE) 1000 MG tablet 1 po bid 07/13/17  Yes Phoenicia Pirie, Damaris Hippo, PA-C  Multiple Vitamin (THERA) TABS Take by mouth.   Yes [provider]  omeprazole (PRILOSEC) 40 MG capsule Take 1 capsule (40 mg total) by mouth 2 (two) times daily. Patient taking differently: Take 40 mg by mouth daily.  07/13/17  Yes Shabnam Ladd L, PA-C  rosuvastatin (CRESTOR) 5 MG tablet Take 1 tablet (5 mg total) by mouth daily. 12/10/17  Yes Miraya Cudney, Damaris Hippo, PA-C      ELECTROCARDIOGRAM -   Review of Systems  Constitutional:  Negative.   HENT: Negative.   Eyes: Negative.   Respiratory: Negative.   Cardiovascular: Negative.   Gastrointestinal: Negative.   Endocrine: Negative.   Genitourinary: Positive for difficulty urinating (increase frequency, 4-5 times at night,  standing over the toliet - no dribbling).  Musculoskeletal: Negative.   Skin: Negative.   Allergic/Immunologic: Negative.   Neurological: Negative.        Tingling feet - no change recently - wants to continue to deal with it  Hematological: Negative.   Psychiatric/Behavioral: Positive for dysphoric mood (due to ercent death of sister) and sleep disturbance (due to his sister recent death).    PHYSICAL EXAM: BP 102/60   Pulse  95   Temp 98.8 F (37.1 C) (Oral)   Resp 18   Ht '5\' 10"'  (1.778 m)   Wt 212 lb 3.2 oz (96.3 kg)   SpO2 97%   BMI 30.45 kg/m   Wt Readings from Last 3 Encounters:  01/26/18 212 lb 3.2 oz (96.3 kg)  10/13/17 215 lb 9.6 oz (97.8 kg)  09/30/17 213 lb (96.6 kg)      Visual Acuity Screening   Right eye Left eye Both eyes  Without correction:     With correction: 20/25 20/20-1 20/20    Physical Exam  Vitals reviewed. Constitutional: He is oriented to person, place, and time. He appears well-developed and well-nourished.  HENT:  Head: Normocephalic and atraumatic.  Right Ear: External ear normal.  Left Ear: External ear normal.  Nose: Nose normal.  Mouth/Throat: Oropharynx is clear and moist.  Eyes: Conjunctivae and EOM are normal. Pupils are equal, round, and reactive to light.  Neck: Normal range of motion. Neck supple. No thyroid mass and no thyromegaly present.  Cardiovascular: Normal rate, regular rhythm and normal heart sounds.  No murmur heard. Respiratory: Effort normal and breath sounds normal. He has no wheezes.  GI: Soft. Bowel sounds are normal.  Genitourinary: Penis normal. Prostate is enlarged. Prostate is not tender.  Musculoskeletal: Normal range of motion.  Neurological: He is alert and  oriented to person, place, and time. He has normal reflexes. He displays normal reflexes. No cranial nerve deficit. Coordination normal.  Skin: Skin is warm and dry.  Psychiatric: He has a normal mood and affect. His behavior is normal. Judgment and thought content normal.   Rhythm: sinus rythym at a rate of 84. Findings: NSR without acute change Last EKG: no available I have personally reviewed the EKG tracing and agree with the computerized printout.   Education/Counseling: yes diet and exercise yes prevention of chronic diseases Nonsmoker- smoking/tobacco cessation yes review "Covered Medicare Preventive Services"   ASSESSMENT/PLAN: Medicare annual wellness visit, initial  Uncontrolled type 2 diabetes mellitus with diabetic polyneuropathy, with long-term current use of insulin ([place]) - Plan: CMP14+EGFR, Hemoglobin A1c, liraglutide (VICTOZA) 18 MG/3ML SOPN, Insulin Glargine (LANTUS) 100 UNIT/ML Solostar Pen - pt had an increase in the cost of his victozia - he will see if he can find it cheaper and if not we will determine if another medication in this class will be cheaper for him  Essential hypertension - Plan: CMP14+EGFR, EKG 12-Lead - well controlled on medications  Elevated cholesterol - Plan: Lipid panel - check labs adjust medications if needed -  Abnormal bone density screening - Plan: DG Bone Density  Screening for AAA (aortic abdominal aneurysm) - Plan: US Abdomen Limited - dad had this -   Former smoker - Plan: US Abdomen Limited  Screening for prostate cancer - Plan: PSA - dad had prostate cancer  Grief reaction - Plan: ALPRAZolam (XANAX) 0.25 MG tablet - will give a refill - this is not something that I am willing to do for nightly sleep issues but with the recent death of his sister  Esophagitis - Plan: omeprazole (PRILOSEC) 40 MG capsule - continue current medications   Windell Hummingbird PA-C  [facility] 01/26/2018 1:12  PM

## 2018-01-26 NOTE — Patient Instructions (Addendum)
Advance Directive Advance directives are legal documents that let you make choices ahead of time about your health care and medical treatment in case you become unable to communicate for yourself. Advance directives are a way for you to communicate your wishes to family, friends, and health care providers. This can help convey your decisions about end-of-life care if you become unable to communicate. Discussing and writing advance directives should happen over time rather than all at once. Advance directives can be changed depending on your situation and what you want, even after you have signed the advance directives. If you do not have an advance directive, some states assign family decision makers to act on your behalf based on how closely you are related to them. Each state has its own laws regarding advance directives. You may want to check with your health care provider, attorney, or state representative about the laws in your state. There are different types of advance directives, such as:  Medical power of attorney.  Living will.  Do not resuscitate (DNR) or do not attempt resuscitation (DNAR) order.  Health care proxy and medical power of attorney A health care proxy, also called a health care agent, is a person who is appointed to make medical decisions for you in cases in which you are unable to make the decisions yourself. Generally, people choose someone they know well and trust to represent their preferences. Make sure to ask this person for an agreement to act as your proxy. A proxy may have to exercise judgment in the event of a medical decision for which your wishes are not known. A medical power of attorney is a legal document that names your health care proxy. Depending on the laws in your state, after the document is written, it may also need to be:  Signed.  Notarized.  Dated.  Copied.  Witnessed.  Incorporated into your medical record.  You may also want to  appoint someone to manage your financial affairs in a situation in which you are unable to do so. This is called a durable power of attorney for finances. It is a separate legal document from the durable power of attorney for health care. You may choose the same person or someone different from your health care proxy to act as your agent in financial matters. If you do not appoint a proxy, or if there is a concern that the proxy is not acting in your best interests, a court-appointed guardian may be designated to act on your behalf. Living will A living will is a set of instructions documenting your wishes about medical care when you cannot express them yourself. Health care providers should keep a copy of your living will in your medical record. You may want to give a copy to family members or friends. To alert caregivers in case of an emergency, you can place a card in your wallet to let them know that you have a living will and where they can find it. A living will is used if you become:  Terminally ill.  Incapacitated.  Unable to communicate or make decisions.  Items to consider in your living will include:  The use or non-use of life-sustaining equipment, such as dialysis machines and breathing machines (ventilators).  A DNR or DNAR order, which is the instruction not to use cardiopulmonary resuscitation (CPR) if breathing or heartbeat stops.  The use or non-use of tube feeding.  Withholding of food and fluids.  Comfort (palliative) care when the goal  becomes comfort rather than a cure.  Organ and tissue donation.  A living will does not give instructions for distributing your money and property if you should pass away. It is recommended that you seek the advice of a lawyer when writing a will. Decisions about taxes, beneficiaries, and asset distribution will be legally binding. This process can relieve your family and friends of any concerns surrounding disputes or questions that may  come up about the distribution of your assets. DNR or DNAR A DNR or DNAR order is a request not to have CPR in the event that your heart stops beating or you stop breathing. If a DNR or DNAR order has not been made and shared, a health care provider will try to help any patient whose heart has stopped or who has stopped breathing. If you plan to have surgery, talk with your health care provider about how your DNR or DNAR order will be followed if problems occur. Summary  Advance directives are the legal documents that allow you to make choices ahead of time about your health care and medical treatment in case you become unable to communicate for yourself.  The process of discussing and writing advance directives should happen over time. You can change the advance directives, even after you have signed them.  Advance directives include DNR or DNAR orders, living wills, and designating an agent as your medical power of attorney. This information is not intended to replace advice given to you by your health care provider. Make sure you discuss any questions you have with your health care provider. Document Released: 03/23/2008 Document Revised: 11/03/2016 Document Reviewed: 11/03/2016 Elsevier Interactive Patient Education  2017 [employer].    IF you received an x-ray today, you will receive an invoice from [facility]. Please contact [facility] at [phone] with questions or concerns regarding your invoice.   IF you received labwork today, you will receive an invoice from [facility]. Please contact [facility] at [phone] with questions or concerns regarding your invoice.   Our billing staff will not be able to assist you with questions regarding bills from these companies.  You will be contacted with the lab results as soon as they are available. The fastest way to get your results is to activate your My Chart account. Instructions are located on the last page of this  paperwork. If you have not heard from [place] regarding the results in 2 weeks, please contact this office.    Consider magnesium supplement to help with cramping. Consider Flomax to help with urine stream  [facility] program - they might be able to help with cost of medications - look for the patient assistant program for Victozia if it is still so expensive

## 2018-01-27 DIAGNOSIS — H2513 Age-related nuclear cataract, bilateral: Secondary | ICD-10-CM | POA: Diagnosis not present

## 2018-01-27 DIAGNOSIS — E119 Type 2 diabetes mellitus without complications: Secondary | ICD-10-CM | POA: Diagnosis not present

## 2018-01-27 DIAGNOSIS — H1851 Endothelial corneal dystrophy: Secondary | ICD-10-CM | POA: Diagnosis not present

## 2018-01-27 DIAGNOSIS — H17822 Peripheral opacity of cornea, left eye: Secondary | ICD-10-CM | POA: Diagnosis not present

## 2018-01-27 DIAGNOSIS — H43811 Vitreous degeneration, right eye: Secondary | ICD-10-CM | POA: Diagnosis not present

## 2018-01-27 LAB — CMP14+EGFR
ALT: 23 IU/L (ref 0–44)
AST: 18 IU/L (ref 0–40)
Albumin/Globulin Ratio: 1.9 (ref 1.2–2.2)
Albumin: 4.5 g/dL (ref 3.6–4.8)
Alkaline Phosphatase: 99 IU/L (ref 39–117)
BUN/Creatinine Ratio: 15 (ref 10–24)
BUN: 16 mg/dL (ref 8–27)
Bilirubin Total: 0.4 mg/dL (ref 0.0–1.2)
CO2: 20 mmol/L (ref 20–29)
Calcium: 10 mg/dL (ref 8.6–10.2)
Chloride: 96 mmol/L (ref 96–106)
Creatinine, Ser: 1.05 mg/dL (ref 0.76–1.27)
GFR calc Af Amer: 86 mL/min/{1.73_m2} (ref >59)
GFR calc non Af Amer: 74 mL/min/{1.73_m2} (ref >59)
Globulin, Total: 2.4 g/dL (ref 1.5–4.5)
Glucose: 142 mg/dL — ABNORMAL HIGH (ref 65–99)
Potassium: 4.5 mmol/L (ref 3.5–5.2)
Sodium: 137 mmol/L (ref 134–144)
Total Protein: 6.9 g/dL (ref 6.0–8.5)

## 2018-01-27 LAB — HM DIABETES EYE EXAM

## 2018-01-27 LAB — LIPID PANEL
Chol/HDL Ratio: 2.9 ratio (ref 0.0–5.0)
Cholesterol, Total: 117 mg/dL (ref 100–199)
HDL: 41 mg/dL (ref >39)
LDL Calculated: 48 mg/dL (ref 0–99)
Triglycerides: 141 mg/dL (ref 0–149)
VLDL Cholesterol Cal: 28 mg/dL (ref 5–40)

## 2018-01-27 LAB — HEMOGLOBIN A1C
Est. average glucose Bld gHb Est-mCnc: 192 mg/dL
Hgb A1c MFr Bld: 8.3 % — ABNORMAL HIGH (ref 4.8–5.6)

## 2018-01-27 LAB — PSA: Prostate Specific Ag, Serum: 2.8 ng/mL (ref 0.0–4.0)

## 2018-02-25 ENCOUNTER — Other Ambulatory Visit: Payer: Self-pay | Admitting: Physician Assistant

## 2018-02-25 DIAGNOSIS — R351 Nocturia: Principal | ICD-10-CM

## 2018-02-25 DIAGNOSIS — N401 Enlarged prostate with lower urinary tract symptoms: Secondary | ICD-10-CM

## 2018-02-25 DIAGNOSIS — F4321 Adjustment disorder with depressed mood: Secondary | ICD-10-CM

## 2018-02-26 ENCOUNTER — Encounter: Payer: Self-pay | Admitting: Physician Assistant

## 2018-03-30 ENCOUNTER — Other Ambulatory Visit: Payer: Self-pay | Admitting: Physician Assistant

## 2018-03-30 DIAGNOSIS — R351 Nocturia: Principal | ICD-10-CM

## 2018-03-30 DIAGNOSIS — N401 Enlarged prostate with lower urinary tract symptoms: Secondary | ICD-10-CM

## 2018-04-14 ENCOUNTER — Encounter: Payer: Self-pay | Admitting: Physician Assistant

## 2018-04-14 DIAGNOSIS — E1165 Type 2 diabetes mellitus with hyperglycemia: Principal | ICD-10-CM

## 2018-04-14 DIAGNOSIS — E1142 Type 2 diabetes mellitus with diabetic polyneuropathy: Secondary | ICD-10-CM

## 2018-04-14 DIAGNOSIS — IMO0002 Reserved for concepts with insufficient information to code with codable children: Secondary | ICD-10-CM

## 2018-04-14 DIAGNOSIS — Z794 Long term (current) use of insulin: Principal | ICD-10-CM

## 2018-04-14 MED ORDER — PEN NEEDLES 31G X 5 MM MISC: 2.0000 | Freq: Every day | 6 refills | Status: AC

## 2018-04-16 ENCOUNTER — Telehealth: Payer: Self-pay | Admitting: Physician Assistant

## 2018-04-16 NOTE — Telephone Encounter (Signed)
Please advise 

## 2018-04-16 NOTE — Telephone Encounter (Signed)
For now I will cancel - at his next appt I will check his Vit D - if that is low I will order again

## 2018-04-16 NOTE — Telephone Encounter (Signed)
Copied from [place] [id number]. Topic: Quick Communication - See Telephone Encounter >> Apr 16, 2018 10:52 AM Margot Ables wrote: CRM for notification. See Telephone encounter for: 04/16/18.  The order for bone density needs to be changed because the DX is not accepted for insurance to cover. Calcium deficiency & Vit d deficincy are examples of covered DX.  Order has been pending since January. She states they have called 5x and hold too long and hang up.

## 2018-04-21 ENCOUNTER — Encounter: Payer: Self-pay | Admitting: Physician Assistant

## 2018-04-21 DIAGNOSIS — E1165 Type 2 diabetes mellitus with hyperglycemia: Principal | ICD-10-CM

## 2018-04-21 DIAGNOSIS — Z794 Long term (current) use of insulin: Principal | ICD-10-CM

## 2018-04-21 DIAGNOSIS — E1142 Type 2 diabetes mellitus with diabetic polyneuropathy: Secondary | ICD-10-CM

## 2018-04-21 DIAGNOSIS — IMO0002 Reserved for concepts with insufficient information to code with codable children: Secondary | ICD-10-CM

## 2018-04-22 MED ORDER — INSULIN GLARGINE 100 UNIT/ML SOLOSTAR PEN: PEN_INJECTOR | SUBCUTANEOUS | 3 refills | Status: AC

## 2018-04-22 MED ORDER — LIRAGLUTIDE 18 MG/3ML ~~LOC~~ SOPN
1.8000 mg | PEN_INJECTOR | Freq: Every day | SUBCUTANEOUS | 3 refills | Status: DC
Start: 2018-04-22 — End: 2018-05-14

## 2018-04-28 ENCOUNTER — Encounter: Payer: Self-pay | Admitting: Physician Assistant

## 2018-04-30 ENCOUNTER — Ambulatory Visit: Payer: PPO

## 2018-05-04 ENCOUNTER — Ambulatory Visit (INDEPENDENT_AMBULATORY_CARE_PROVIDER_SITE_OTHER): Payer: PPO | Admitting: Physician Assistant

## 2018-05-04 ENCOUNTER — Ambulatory Visit (INDEPENDENT_AMBULATORY_CARE_PROVIDER_SITE_OTHER): Payer: PPO

## 2018-05-04 ENCOUNTER — Ambulatory Visit: Payer: PPO

## 2018-05-04 ENCOUNTER — Encounter: Payer: Self-pay | Admitting: Physician Assistant

## 2018-05-04 VITALS — BP 120/70 | HR 88 | Temp 98.6°F | Resp 16 | Ht 70.0 in | Wt 212.4 lb

## 2018-05-04 DIAGNOSIS — R112 Nausea with vomiting, unspecified: Secondary | ICD-10-CM | POA: Diagnosis not present

## 2018-05-04 DIAGNOSIS — Z0000 Encounter for general adult medical examination without abnormal findings: Secondary | ICD-10-CM

## 2018-05-04 DIAGNOSIS — R197 Diarrhea, unspecified: Secondary | ICD-10-CM | POA: Diagnosis not present

## 2018-05-04 LAB — POCT CBC
Granulocyte percent: 74 %G (ref 37–80)
HCT, POC: 42.1 % — AB (ref 43.5–53.7)
Hemoglobin: 14.2 g/dL (ref 14.1–18.1)
Lymph, poc: 2.9 (ref 0.6–3.4)
MCH, POC: 28.7 pg (ref 27–31.2)
MCHC: 33.7 g/dL (ref 31.8–35.4)
MCV: 85.1 fL (ref 80–97)
MID (cbc): 0.6 (ref 0–0.9)
MPV: 7.6 fL (ref 0–99.8)
POC Granulocyte: 10.2 — AB (ref 2–6.9)
POC LYMPH PERCENT: 21.3 %L (ref 10–50)
POC MID %: 4.7 %M (ref 0–12)
Platelet Count, POC: 308 10*3/uL (ref 142–424)
RBC: 4.94 M/uL (ref 4.69–6.13)
RDW, POC: 13.4 %
WBC: 13.8 10*3/uL — AB (ref 4.6–10.2)

## 2018-05-04 NOTE — Progress Notes (Signed)
Andrew Roberson  MRN: [id number] DOB: [birth year]  PCP: Andrew Bale, PA-C  Subjective:  Pt is a 67 year old male who presents to clinic for diarrhea and vomiting.  Three days ago he woke up and felt "decent" - all of a sudden he experienced one episode of vomiting.  He drank Gatorade and soup that night.  Next day felt decent. Sore and fatigued. 2 episodes of diarrhea that day - no blood or mucus in stool.  Yesterday he had to sit down, "felt chilled" "My left shoulder felt sore". Left hand "felt like loss of feeling".  Today he has had 2 episodes of diarrhea.   Recently he is cooking at home more- he has noticed connection with foods and diarrhea. He has cut out milk "it seems to have helped".   He went out to eat 6 days ago to a new restaurant "food was really greasy".  He had pastrami sandwich.  Denies chest pain, neck pain,   He suspects stress could be contributing to his symptoms. He lost his mother, father and sister in the last 3 years. He is dealing with estate issues with sisters' husband.   Omeprazole once daily.   Review of Systems  Constitutional: Positive for fatigue. Negative for chills, diaphoresis and fever.  Cardiovascular: Negative for chest pain and palpitations.  Gastrointestinal: Positive for diarrhea and vomiting. Negative for abdominal distention and blood in stool.  Musculoskeletal: Negative for arthralgias and neck pain.    Patient Active Problem List   Diagnosis Date Noted  . Uncontrolled type 2 diabetes mellitus with diabetic polyneuropathy, with long-term current use of insulin (Andrew Roberson) 07/13/2017  . HTN (hypertension) 07/13/2017  . Elevated cholesterol 07/13/2017  . Esophagitis 07/13/2017  . History of esophageal stricture 07/13/2017    Current Outpatient Medications on File Prior to Visit  Medication Sig Dispense Refill  . ALPRAZolam (XANAX) 0.25 MG tablet TAKE 1 TABLET (0.25 MG TOTAL) BY MOUTH AT BEDTIME AS NEEDED. FOR SLEEP 30 tablet 0  .  aspirin 81 MG chewable tablet Chew by mouth.    . Cyanocobalamin (B-12) 2500 MCG TABS Take by mouth.    . Insulin Glargine (LANTUS) 100 UNIT/ML Solostar Pen INJECT 20 UNITS SUBCUTANEOUSLY EVERY DAY 15 mL 3  . Insulin Pen Needle (PEN NEEDLES) 31G X 5 MM MISC 2 each by Does not apply route daily. 100 each 6  . liraglutide (VICTOZA) 18 MG/3ML SOPN Inject 0.3 mLs (1.8 mg total) into the skin daily. 9 mL 3  . lisinopril (PRINIVIL,ZESTRIL) 10 MG tablet Take 1 tablet (10 mg total) by mouth daily. 90 tablet 3  . metFORMIN (GLUCOPHAGE) 1000 MG tablet 1 po bid 180 tablet 4  . Multiple Vitamin (THERA) TABS Take by mouth.    Marland Kitchen omeprazole (PRILOSEC) 40 MG capsule Take 1 capsule (40 mg total) by mouth daily. 90 capsule 4  . rosuvastatin (CRESTOR) 5 MG tablet Take 1 tablet (5 mg total) by mouth daily. 90 tablet 0  . tamsulosin (FLOMAX) 0.4 MG CAPS capsule TAKE 1 CAPSULE BY MOUTH EVERY DAY 30 capsule 0   No current facility-administered medications on file prior to visit.     Allergies  Allergen Reactions  . Codeine     Other reaction(s): Abdominal Pain     Objective:  BP 120/70 (BP Location: Left Arm, Patient Position: Sitting, Cuff Size: Normal)   Pulse 88   Temp 98.6 F (37 C) (Oral)   Resp 16   Ht '5\' 10"'$  (1.778 m)  Wt 212 lb 6.4 oz (96.3 kg)   SpO2 99%   BMI 30.48 kg/m   Physical Exam  Constitutional: He is oriented to person, place, and time. He appears well-developed and well-nourished.  Cardiovascular: Normal rate and regular rhythm.  Pulmonary/Chest: Effort normal. No respiratory distress.  Abdominal: Soft. Bowel sounds are normal. He exhibits no distension and no mass. There is no tenderness. There is no guarding.  Neurological: He is alert and oriented to person, place, and time.  Skin: Skin is warm and dry.  Psychiatric: He has a normal mood and affect. His behavior is normal. Judgment and thought content normal.  Vitals reviewed.   Results for orders placed or performed in  visit on 05/04/18  POCT CBC  Result Value Ref Range   WBC 13.8 (A) 4.6 - 10.2 K/uL   Lymph, poc 2.9 0.6 - 3.4   POC LYMPH PERCENT 21.3 10 - 50 %L   MID (cbc) 0.6 0 - 0.9   POC MID % 4.7 0 - 12 %M   POC Granulocyte 10.2 (A) 2 - 6.9   Granulocyte percent 74.0 37 - 80 %G   RBC 4.94 4.69 - 6.13 M/uL   Hemoglobin 14.2 14.1 - 18.1 g/dL   HCT, POC 42.1 (A) 43.5 - 53.7 %   MCV 85.1 80 - 97 fL   MCH, POC 28.7 27 - 31.2 pg   MCHC 33.7 31.8 - 35.4 g/dL   RDW, POC 13.4 %   Platelet Count, POC 308 142 - 424 K/uL   MPV 7.6 0 - 99.8 fL   EKG - normal simus rhythm. Unchanged from prior tracing 4 months ago.  Assessment and Plan :  1. Nausea and vomiting, intractability of vomiting not specified, unspecified vomiting type 2. Diarrhea, unspecified type - GI Profile, Stool, PCR - POCT CBC - CMP14+EGFR - EKG 12-Lead - DG Abd 2 Views; Future - pt presents with an interesting constellation of symptoms. EKG is not concerning today. PE is unremarkable. WBC count is 13.8 with a shift. Plan to treat supportively with instruction to return stool sample. He has an appt with Andrew Roberson in 10 days. RTC sooner if no improvement.   Andrew Pod, PA-C  [facility] [place] 05/04/2018 5:51 PM

## 2018-05-04 NOTE — Progress Notes (Unsigned)
Patient seen for Annual Wellness visit on 01/26/18.   However, patient states that he has been having some GI symptoms to include diarrhea and an episode of left arm/shoulder numbness and tingling. Patient advised to be seen for office visit. Scheduled patient for 4:40 today with Whitney McVey.

## 2018-05-04 NOTE — Patient Instructions (Addendum)
I am treating you today for viral gastroenteritis. Please see below for diet to follow in the next week (or until you feel better).  Your diarrhea may take 5-7 days to improve.  Return your stool sample if your diarrhea fails to improve.   Follow-up with Windell Hummingbird.    Food Choices to Help Relieve Diarrhea, Adult When you have diarrhea, the foods you eat and your eating habits are very important. Choosing the right foods and drinks can help:  Relieve diarrhea.  Replace lost fluids and nutrients.  Prevent dehydration.  What general guidelines should I follow? Relieving diarrhea  Choose foods with less than 2 g or .07 oz. of fiber per serving.  Limit fats to less than 8 tsp (38 g or 1.34 oz.) a day.  Avoid the following: ? Foods and beverages sweetened with high-fructose corn syrup, honey, or sugar alcohols such as xylitol, sorbitol, and mannitol. ? Foods that contain a lot of fat or sugar. ? Fried, greasy, or spicy foods. ? High-fiber grains, breads, and cereals. ? Raw fruits and vegetables.  Eat foods that are rich in probiotics. These foods include dairy products such as yogurt and fermented milk products. They help increase healthy bacteria in the stomach and intestines (gastrointestinal tract, or GI tract).  If you have lactose intolerance, avoid dairy products. These may make your diarrhea worse.  Take medicine to help stop diarrhea (antidiarrheal medicine) only as told by your health care provider. Replacing nutrients  Eat small meals or snacks every 3-4 hours.  Eat bland foods, such as white rice, toast, or baked potato, until your diarrhea starts to get better. Gradually reintroduce nutrient-rich foods as tolerated or as told by your health care provider. This includes: ? Well-cooked protein foods. ? Peeled, seeded, and soft-cooked fruits and vegetables. ? Low-fat dairy products.  Take vitamin and mineral supplements as told by your health care provider. Preventing  dehydration   Start by sipping water or a special solution to prevent dehydration (oral rehydration solution, ORS). Urine that is clear or pale yellow means that you are getting enough fluid.  Try to drink at least 8-10 cups of fluid each day to help replace lost fluids.  You may add other liquids in addition to water, such as clear juice or decaffeinated sports drinks, as tolerated or as told by your health care provider.  Avoid drinks with caffeine, such as coffee, tea, or soft drinks.  Avoid alcohol. What foods are recommended? The items listed may not be a complete list. Talk with your health care provider about what dietary choices are best for you. Grains White rice. White, [place], or pita breads (fresh or toasted), including plain rolls, buns, or bagels. White pasta. Saltine, soda, or graham crackers. Pretzels. Low-fiber cereal. Cooked cereals made with water (such as cornmeal, farina, or cream cereals). Plain muffins. Matzo. Melba toast. Zwieback. Vegetables Potatoes (without the skin). Most well-cooked and canned vegetables without skins or seeds. Tender lettuce. Fruits Apple sauce. Fruits canned in juice. Cooked apricots, cherries, grapefruit, peaches, pears, or plums. Fresh bananas and cantaloupe. Meats and other protein foods Baked or boiled chicken. Eggs. Tofu. Fish. Seafood. Smooth nut butters. Ground or well-cooked tender beef, ham, veal, lamb, pork, or poultry. Dairy Plain yogurt, kefir, and unsweetened liquid yogurt. Lactose-free milk, buttermilk, skim milk, or soy milk. Low-fat or nonfat hard cheese. Beverages Water. Low-calorie sports drinks. Fruit juices without pulp. Strained tomato and vegetable juices. Decaffeinated teas. Sugar-free beverages not sweetened with sugar alcohols. Oral rehydration  solutions, if approved by your health care provider. Seasoning and other foods Bouillon, broth, or soups made from recommended foods. What foods are not recommended? The  items listed may not be a complete list. Talk with your health care provider about what dietary choices are best for you. Grains Whole grain, whole wheat, bran, or rye breads, rolls, pastas, and crackers. Wild or brown rice. Whole grain or bran cereals. Barley. Oats and oatmeal. Corn tortillas or taco shells. Granola. Popcorn. Vegetables Raw vegetables. Fried vegetables. Cabbage, broccoli, Brussels sprouts, artichokes, baked beans, beet greens, corn, kale, legumes, peas, sweet potatoes, and yams. Potato skins. Cooked spinach and cabbage. Fruits Dried fruit, including raisins and dates. Raw fruits. Stewed or dried prunes. Canned fruits with syrup. Meat and other protein foods Fried or fatty meats. Deli meats. Chunky nut butters. Nuts and seeds. Beans and lentils. Berniece Salines. Hot dogs. Sausage. Dairy High-fat cheeses. Whole milk, chocolate milk, and beverages made with milk, such as milk shakes. Half-and-half. Cream. sour cream. Ice cream. Beverages Caffeinated beverages (such as coffee, tea, soda, or energy drinks). Alcoholic beverages. Fruit juices with pulp. Prune juice. Soft drinks sweetened with high-fructose corn syrup or sugar alcohols. High-calorie sports drinks. Fats and oils Butter. Cream sauces. Margarine. Salad oils. Plain salad dressings. Olives. Avocados. Mayonnaise. Sweets and desserts Sweet rolls, doughnuts, and sweet breads. Sugar-free desserts sweetened with sugar alcohols such as xylitol and sorbitol. Seasoning and other foods Honey. Hot sauce. Chili powder. Gravy. Cream-based or milk-based soups. Pancakes and waffles. Summary  When you have diarrhea, the foods you eat and your eating habits are very important.  Make sure you get at least 8-10 cups of fluid each day, or enough to keep your urine clear or pale yellow.  Eat bland foods and gradually reintroduce healthy, nutrient-rich foods as tolerated, or as told by your health care provider.  Avoid high-fiber, fried, greasy, or  spicy foods. This information is not intended to replace advice given to you by your health care provider. Make sure you discuss any questions you have with your health care provider. Document Released: 03/06/2004 Document Revised: 12/12/2016 Document Reviewed: 12/12/2016 Elsevier Interactive Patient Education  2018 [employer].  IF you received an x-ray today, you will receive an invoice from [facility]. Please contact [facility] at [phone] with questions or concerns regarding your invoice.   IF you received labwork today, you will receive an invoice from [place]. Please contact [facility] at [phone] with questions or concerns regarding your invoice.   Our billing staff will not be able to assist you with questions regarding bills from these companies.  You will be contacted with the lab results as soon as they are available. The fastest way to get your results is to activate your My Chart account. Instructions are located on the last page of this paperwork. If you have not heard from [place] regarding the results in 2 weeks, please contact this office.

## 2018-05-05 LAB — CMP14+EGFR
ALT: 16 IU/L (ref 0–44)
AST: 9 IU/L (ref 0–40)
Albumin/Globulin Ratio: 2 (ref 1.2–2.2)
Albumin: 4.5 g/dL (ref 3.6–4.8)
Alkaline Phosphatase: 88 IU/L (ref 39–117)
BUN/Creatinine Ratio: 13 (ref 10–24)
BUN: 12 mg/dL (ref 8–27)
Bilirubin Total: 0.6 mg/dL (ref 0.0–1.2)
CO2: 22 mmol/L (ref 20–29)
Calcium: 10 mg/dL (ref 8.6–10.2)
Chloride: 98 mmol/L (ref 96–106)
Creatinine, Ser: 0.96 mg/dL (ref 0.76–1.27)
GFR calc Af Amer: 95 mL/min/{1.73_m2} (ref >59)
GFR calc non Af Amer: 82 mL/min/{1.73_m2} (ref >59)
Globulin, Total: 2.3 g/dL (ref 1.5–4.5)
Glucose: 150 mg/dL — ABNORMAL HIGH (ref 65–99)
Potassium: 4.7 mmol/L (ref 3.5–5.2)
Sodium: 135 mmol/L (ref 134–144)
Total Protein: 6.8 g/dL (ref 6.0–8.5)

## 2018-05-06 ENCOUNTER — Other Ambulatory Visit: Payer: Self-pay | Admitting: Physician Assistant

## 2018-05-06 DIAGNOSIS — N401 Enlarged prostate with lower urinary tract symptoms: Secondary | ICD-10-CM

## 2018-05-06 DIAGNOSIS — R351 Nocturia: Principal | ICD-10-CM

## 2018-05-07 ENCOUNTER — Ambulatory Visit: Payer: PPO | Admitting: Physician Assistant

## 2018-05-07 DIAGNOSIS — R197 Diarrhea, unspecified: Secondary | ICD-10-CM | POA: Diagnosis not present

## 2018-05-10 LAB — GI PROFILE, STOOL, PCR

## 2018-05-14 ENCOUNTER — Encounter: Payer: Self-pay | Admitting: Physician Assistant

## 2018-05-14 ENCOUNTER — Ambulatory Visit (INDEPENDENT_AMBULATORY_CARE_PROVIDER_SITE_OTHER): Payer: PPO | Admitting: Physician Assistant

## 2018-05-14 ENCOUNTER — Other Ambulatory Visit: Payer: Self-pay

## 2018-05-14 VITALS — BP 132/64 | HR 90 | Temp 98.8°F | Resp 18 | Ht 70.0 in | Wt 211.6 lb

## 2018-05-14 DIAGNOSIS — K209 Esophagitis, unspecified without bleeding: Secondary | ICD-10-CM

## 2018-05-14 DIAGNOSIS — R351 Nocturia: Secondary | ICD-10-CM

## 2018-05-14 DIAGNOSIS — Z794 Long term (current) use of insulin: Secondary | ICD-10-CM

## 2018-05-14 DIAGNOSIS — Z1321 Encounter for screening for nutritional disorder: Secondary | ICD-10-CM

## 2018-05-14 DIAGNOSIS — I10 Essential (primary) hypertension: Secondary | ICD-10-CM | POA: Diagnosis not present

## 2018-05-14 DIAGNOSIS — R972 Elevated prostate specific antigen [PSA]: Secondary | ICD-10-CM

## 2018-05-14 DIAGNOSIS — E1142 Type 2 diabetes mellitus with diabetic polyneuropathy: Secondary | ICD-10-CM

## 2018-05-14 DIAGNOSIS — E7800 Pure hypercholesterolemia, unspecified: Secondary | ICD-10-CM

## 2018-05-14 DIAGNOSIS — E1165 Type 2 diabetes mellitus with hyperglycemia: Secondary | ICD-10-CM | POA: Diagnosis not present

## 2018-05-14 DIAGNOSIS — N401 Enlarged prostate with lower urinary tract symptoms: Secondary | ICD-10-CM | POA: Diagnosis not present

## 2018-05-14 DIAGNOSIS — G4700 Insomnia, unspecified: Secondary | ICD-10-CM

## 2018-05-14 DIAGNOSIS — IMO0002 Reserved for concepts with insufficient information to code with codable children: Secondary | ICD-10-CM

## 2018-05-14 MED ORDER — LISINOPRIL 10 MG PO TABS: 10.0000 mg | ORAL_TABLET | Freq: Every day | ORAL | 3 refills | Status: AC

## 2018-05-14 MED ORDER — LIRAGLUTIDE 18 MG/3ML ~~LOC~~ SOPN: 1.8000 mg | PEN_INJECTOR | Freq: Every day | SUBCUTANEOUS | 3 refills | Status: DC

## 2018-05-14 MED ORDER — TAMSULOSIN HCL 0.4 MG PO CAPS: 0.4000 mg | ORAL_CAPSULE | Freq: Every day | ORAL | 4 refills | Status: AC

## 2018-05-14 MED ORDER — OMEPRAZOLE 40 MG PO CPDR: 40.0000 mg | DELAYED_RELEASE_CAPSULE | Freq: Every day | ORAL | 4 refills | Status: DC

## 2018-05-14 MED ORDER — TRAZODONE HCL 50 MG PO TABS: 25.0000 mg | ORAL_TABLET | Freq: Every evening | ORAL | 3 refills | Status: DC | PRN

## 2018-05-14 MED ORDER — METFORMIN HCL 1000 MG PO TABS
ORAL_TABLET | ORAL | 4 refills | Status: DC
Start: 2018-05-14 — End: 2018-10-21

## 2018-05-14 MED ORDER — ROSUVASTATIN CALCIUM 5 MG PO TABS: 5.0000 mg | ORAL_TABLET | Freq: Every day | ORAL | 3 refills | Status: AC

## 2018-05-14 NOTE — Progress Notes (Signed)
Andrew Roberson  MRN: [id number] DOB: 10-06-1951  PCP: Andrew Bale, PA-C  Chief Complaint  Patient presents with  . Diabetes    3 month follow up  . Hypertension    and elevated cholesterol     Subjective:  Pt presents to clinic for follow-up of chronic medical conditions.  He continues to take his medicine regularly and as prescribed.  He is currently in the Medicare donut hole and having to pay cash for his medicines.  The [name] is going to be over $200 a month which she is slightly worried about.  He has talked to his insurance agent and they are trying to work on ways that he can get this paid for.  He has a list of other medications that the insurance recommends that he go on that he is either on them has been on them or they are not appropriate for the patient.  He will be in the donut hole through August.  Not checking glucose at home.  He has had a lot of stomach issues with recent stress.  He is getting ready to close on his parents house in the upcoming weeks and he hopes that this will help his stress.  He is having trouble sleeping.  He is having trouble falling asleep as well as staying asleep.  He does understand now that Xanax is not the answer to his sleeping problems and he is wondering if there was anything else that would be available to him.  History is obtained by patient.  Review of Systems  Constitutional: Negative for chills and fever.  Eyes: Negative for visual disturbance.  Respiratory: Negative for cough and shortness of breath.   Cardiovascular: Negative for chest pain, palpitations and leg swelling.  Neurological: Negative for dizziness, light-headedness, numbness and headaches.  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 Negative for suicidal ideas.    Patient Active Problem List   Diagnosis  Date Noted  . Uncontrolled type 2 diabetes mellitus with diabetic polyneuropathy, with long-term current use of insulin ([place]) 07/13/2017  . HTN (hypertension) 07/13/2017  . Elevated cholesterol 07/13/2017  . Esophagitis 07/13/2017  . History of esophageal stricture 07/13/2017    Current Outpatient Medications on File Prior to Visit  Medication Sig Dispense Refill  . aspirin 81 MG chewable tablet Chew by mouth.    . Cyanocobalamin (B-12) 2500 MCG TABS Take by mouth.    . Insulin Glargine (LANTUS) 100 UNIT/ML Solostar Pen INJECT 20 UNITS SUBCUTANEOUSLY EVERY DAY 15 mL 3  . Insulin Pen Needle (PEN NEEDLES) 31G X 5 MM MISC 2 each by Does not apply route daily. 100 each 6  . Multiple Vitamin (THERA) TABS Take by mouth.     No current facility-administered medications on file prior to visit.     Allergies  Allergen Reactions  . Codeine     Other reaction(s): Abdominal Pain    Past Medical History:  Diagnosis Date  . Anxiety   . 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   Social History   Tobacco Use  . Smoking status: Former Smoker    Packs/day: 1.00  Years: 25.00    Pack years: 25.00    Last attempt to quit: 07/14/2007    Years since quitting: 10.8  . Smokeless tobacco: Never Used  Substance Use Topics  . Alcohol use: Yes    Comment: social rare  . Drug use: Yes    Types: Marijuana    Comment: social   family history includes Dementia in his mother; Berenice Primas' disease in his sister; Hypertension in his father; Liver disease in his sister; Myelodysplastic syndrome in his father; Prostate cancer in his father; Stroke in his mother.     Objective:  BP 132/64   Pulse 90   Temp 98.8 F (37.1 C) (Oral)   Resp 18   Ht _0  (1.778 m)   Wt 211 lb 9.6 oz (96 kg)   SpO2 98%   BMI 30.36 kg/m  Body mass index is 30.36  kg/m.  Physical Exam  Constitutional: He is oriented to person, place, and time.  HENT:  Head: Normocephalic and atraumatic.  Right Ear: External ear normal.  Left Ear: External ear normal.  Eyes: Conjunctivae are normal.  Neck: Normal range of motion.  Cardiovascular: Normal rate, regular rhythm, normal heart sounds and intact distal pulses.  Pulmonary/Chest: Effort normal and breath sounds normal. He has no wheezes.  Musculoskeletal:       Right lower leg: He exhibits no edema.       Left lower leg: He exhibits no edema.  Neurological: He is alert and oriented to person, place, and time.  Skin: Skin is warm and dry.  Psychiatric: He has a normal mood and affect. His behavior is normal. Judgment and thought content normal.    Assessment and Plan :  Insomnia, unspecified type - Plan: traZODone (DESYREL) 50 MG tablet -patient has been on Xanax in the past but he now understands that this is not a good sleep aid and will try trazodone.  He was given instructions on titration of this medication.  Uncontrolled type 2 diabetes mellitus with diabetic polyneuropathy, with long-term current use of insulin ([place]) - Plan: Hemoglobin A1c, CMP14+EGFR, liraglutide (VICTOZA) 18 MG/3ML SOPN, metFORMIN (GLUCOPHAGE) 1000 MG tablet -patient is having a little bit of trouble affording his big toes at this time.  Given him application for patient assistant program though not sure he is qualified as he has some type of insurance.  We did discuss that the toes of becomes completely unaffordable we can either increase his Lantus or adding additional medicine during this donut hole time.  Then getting back on the Victoza when he is out of the donut hole.  Essential hypertension - Plan: lisinopril (PRINIVIL,ZESTRIL) 10 MG tablet -well-controlled  Elevated cholesterol - Plan: rosuvastatin (CRESTOR) 5 MG tablet -medications refilled cholesterol not checked as last visit 3 months ago well-controlled at that  time  Esophagitis - Plan: omeprazole (PRILOSEC) 40 MG capsule -he did recently decrease the omeprazole to once a day and he has noticed since then that his stomach issues are worse he will try to increase it again to twice daily dosing to see if that controls his stomach symptoms.  The stomach improves he will stay at twice a day for stomach does not improve with increased dose and she will go down to daily dosing again.  I expect some of this has to do with the stress that he has currently on him with the upcoming closing of his home.  Benign prostatic hyperplasia with nocturia - Plan: tamsulosin (FLOMAX) 0.4 MG CAPS capsule  PSA elevation - Plan: PSA recheck labs today if still elevated will need referral -   Encounter for vitamin deficiency screening - Plan: VITAMIN D 25 Hydroxy (Vit-D Deficiency, Fractures) -check labs  Windell Hummingbird PA-C  [facility] [place] 05/14/2018 1:28 PM

## 2018-05-14 NOTE — Patient Instructions (Addendum)
To start the Trazodone - start with 1/2 pill at night for the 1st 4 nights - increase the dose by 25mg  (1/2 pill) every 4-5 nights until either you sleep well, have side effects or reach a nightly dose of 150mg      IF you received an x-ray today, you will receive an invoice from [facility]. Please contact [facility] at [phone] with questions or concerns regarding your invoice.   IF you received labwork today, you will receive an invoice from [place]. Please contact [facility] at [phone] with questions or concerns regarding your invoice.   Our billing staff will not be able to assist you with questions regarding bills from these companies.  You will be contacted with the lab results as soon as they are available. The fastest way to get your results is to activate your My Chart account. Instructions are located on the last page of this paperwork. If you have not heard from [place] regarding the results in 2 weeks, please contact this office.

## 2018-05-15 LAB — CMP14+EGFR
ALT: 24 IU/L (ref 0–44)
AST: 18 IU/L (ref 0–40)
Albumin/Globulin Ratio: 1.9 (ref 1.2–2.2)
Albumin: 4.3 g/dL (ref 3.6–4.8)
Alkaline Phosphatase: 97 IU/L (ref 39–117)
BUN/Creatinine Ratio: 9 — ABNORMAL LOW (ref 10–24)
BUN: 9 mg/dL (ref 8–27)
Bilirubin Total: 0.4 mg/dL (ref 0.0–1.2)
CO2: 24 mmol/L (ref 20–29)
Calcium: 11.5 mg/dL — ABNORMAL HIGH (ref 8.6–10.2)
Chloride: 101 mmol/L (ref 96–106)
Creatinine, Ser: 0.95 mg/dL (ref 0.76–1.27)
GFR calc Af Amer: 96 mL/min/{1.73_m2} (ref >59)
GFR calc non Af Amer: 83 mL/min/{1.73_m2} (ref >59)
Globulin, Total: 2.3 g/dL (ref 1.5–4.5)
Glucose: 131 mg/dL — ABNORMAL HIGH (ref 65–99)
Potassium: 4.3 mmol/L (ref 3.5–5.2)
Sodium: 141 mmol/L (ref 134–144)
Total Protein: 6.6 g/dL (ref 6.0–8.5)

## 2018-05-15 LAB — HEMOGLOBIN A1C
Est. average glucose Bld gHb Est-mCnc: 183 mg/dL
Hgb A1c MFr Bld: 8 % — ABNORMAL HIGH (ref 4.8–5.6)

## 2018-05-15 LAB — VITAMIN D 25 HYDROXY (VIT D DEFICIENCY, FRACTURES): Vit D, 25-Hydroxy: 71.1 ng/mL (ref 30.0–100.0)

## 2018-05-15 LAB — PSA: Prostate Specific Ag, Serum: 2.8 ng/mL (ref 0.0–4.0)

## 2018-05-17 ENCOUNTER — Encounter: Payer: Self-pay | Admitting: Physician Assistant

## 2018-05-18 ENCOUNTER — Other Ambulatory Visit: Payer: Self-pay | Admitting: Physician Assistant

## 2018-05-18 DIAGNOSIS — R972 Elevated prostate specific antigen [PSA]: Secondary | ICD-10-CM

## 2018-05-18 NOTE — Progress Notes (Signed)
Patients PSA went from 1.6 last year to 2.8 - it was repeated in a month and it stayed 2.8.

## 2018-06-30 ENCOUNTER — Other Ambulatory Visit: Payer: Self-pay | Admitting: Physician Assistant

## 2018-06-30 DIAGNOSIS — G4700 Insomnia, unspecified: Secondary | ICD-10-CM

## 2018-06-30 NOTE — Telephone Encounter (Signed)
In order to pend and send this request to PCP, TC to patient to request his dosage at this time. According to 05/14/18 visit note, dosage could range from 25-150 MG. Left message for patient to call with current dosage.

## 2018-07-04 NOTE — Telephone Encounter (Signed)
Med refill req Trazodone sent to [facility]

## 2018-07-13 ENCOUNTER — Other Ambulatory Visit: Payer: Self-pay | Admitting: Physician Assistant

## 2018-07-13 DIAGNOSIS — K209 Esophagitis, unspecified without bleeding: Secondary | ICD-10-CM

## 2018-07-20 DIAGNOSIS — R972 Elevated prostate specific antigen [PSA]: Secondary | ICD-10-CM | POA: Diagnosis not present

## 2018-07-21 DIAGNOSIS — D485 Neoplasm of uncertain behavior of skin: Secondary | ICD-10-CM | POA: Diagnosis not present

## 2018-07-21 DIAGNOSIS — D235 Other benign neoplasm of skin of trunk: Secondary | ICD-10-CM | POA: Diagnosis not present

## 2018-07-21 DIAGNOSIS — L821 Other seborrheic keratosis: Secondary | ICD-10-CM | POA: Diagnosis not present

## 2018-07-21 DIAGNOSIS — Z85828 Personal history of other malignant neoplasm of skin: Secondary | ICD-10-CM | POA: Diagnosis not present

## 2018-07-21 DIAGNOSIS — D1801 Hemangioma of skin and subcutaneous tissue: Secondary | ICD-10-CM | POA: Diagnosis not present

## 2018-07-22 ENCOUNTER — Encounter: Payer: Self-pay | Admitting: *Deleted

## 2018-08-17 ENCOUNTER — Other Ambulatory Visit: Payer: Self-pay

## 2018-08-17 ENCOUNTER — Encounter: Payer: Self-pay | Admitting: Physician Assistant

## 2018-08-17 ENCOUNTER — Ambulatory Visit (INDEPENDENT_AMBULATORY_CARE_PROVIDER_SITE_OTHER): Payer: PPO | Admitting: Physician Assistant

## 2018-08-17 VITALS — BP 130/62 | HR 94 | Temp 98.6°F | Resp 18 | Ht 70.0 in | Wt 213.8 lb

## 2018-08-17 DIAGNOSIS — K209 Esophagitis, unspecified without bleeding: Secondary | ICD-10-CM

## 2018-08-17 DIAGNOSIS — E1142 Type 2 diabetes mellitus with diabetic polyneuropathy: Secondary | ICD-10-CM | POA: Diagnosis not present

## 2018-08-17 DIAGNOSIS — Z23 Encounter for immunization: Secondary | ICD-10-CM | POA: Diagnosis not present

## 2018-08-17 DIAGNOSIS — E1165 Type 2 diabetes mellitus with hyperglycemia: Secondary | ICD-10-CM | POA: Diagnosis not present

## 2018-08-17 DIAGNOSIS — Z794 Long term (current) use of insulin: Secondary | ICD-10-CM | POA: Diagnosis not present

## 2018-08-17 DIAGNOSIS — N401 Enlarged prostate with lower urinary tract symptoms: Secondary | ICD-10-CM

## 2018-08-17 DIAGNOSIS — G4700 Insomnia, unspecified: Secondary | ICD-10-CM

## 2018-08-17 DIAGNOSIS — N400 Enlarged prostate without lower urinary tract symptoms: Secondary | ICD-10-CM | POA: Insufficient documentation

## 2018-08-17 DIAGNOSIS — IMO0002 Reserved for concepts with insufficient information to code with codable children: Secondary | ICD-10-CM

## 2018-08-17 LAB — HEMOGLOBIN A1C
Est. average glucose Bld gHb Est-mCnc: 203 mg/dL
Hgb A1c MFr Bld: 8.7 % — ABNORMAL HIGH (ref 4.8–5.6)

## 2018-08-17 LAB — CMP14+EGFR
ALT: 17 IU/L (ref 0–44)
AST: 8 IU/L (ref 0–40)
Albumin/Globulin Ratio: 1.9 (ref 1.2–2.2)
Albumin: 4.3 g/dL (ref 3.6–4.8)
Alkaline Phosphatase: 95 IU/L (ref 39–117)
BUN/Creatinine Ratio: 14 (ref 10–24)
BUN: 13 mg/dL (ref 8–27)
Bilirubin Total: 0.5 mg/dL (ref 0.0–1.2)
CO2: 21 mmol/L (ref 20–29)
Calcium: 10.5 mg/dL — ABNORMAL HIGH (ref 8.6–10.2)
Chloride: 96 mmol/L (ref 96–106)
Creatinine, Ser: 0.94 mg/dL (ref 0.76–1.27)
GFR calc Af Amer: 97 mL/min/{1.73_m2} (ref >59)
GFR calc non Af Amer: 84 mL/min/{1.73_m2} (ref >59)
Globulin, Total: 2.3 g/dL (ref 1.5–4.5)
Glucose: 154 mg/dL — ABNORMAL HIGH (ref 65–99)
Potassium: 4.6 mmol/L (ref 3.5–5.2)
Sodium: 135 mmol/L (ref 134–144)
Total Protein: 6.6 g/dL (ref 6.0–8.5)

## 2018-08-17 MED ORDER — OMEPRAZOLE 40 MG PO CPDR: 40.0000 mg | DELAYED_RELEASE_CAPSULE | Freq: Two times a day (BID) | ORAL | 3 refills | Status: AC

## 2018-08-17 NOTE — Patient Instructions (Addendum)
[facility], [place], [place] [address] Phone: [phone]   To start the Trazodone - start with 1/2 pill at night for the 1st 4 nights - increase the dose by 25mg  (1/2 pill) every 4-5 nights until either you sleep well, have side effects or reach a nightly dose of 150mg    If you have lab work done today you will be contacted with your lab results within the next 2 weeks.  If you have not heard from [place] then please contact us. The fastest way to get your results is to register for My Chart.   IF you received an x-ray today, you will receive an invoice from [facility]. Please contact [facility] at [phone] with questions or concerns regarding your invoice.   IF you received labwork today, you will receive an invoice from [place]. Please contact [facility] at [phone] with questions or concerns regarding your invoice.   Our billing staff will not be able to assist you with questions regarding bills from these companies.  You will be contacted with the lab results as soon as they are available. The fastest way to get your results is to activate your My Chart account. Instructions are located on the last page of this paperwork. If you have not heard from [place] regarding the results in 2 weeks, please contact this office.

## 2018-08-17 NOTE — Progress Notes (Signed)
Andrew Roberson  MRN: [id number] DOB: Nov 05, 1951  PCP: Andrew Bale, PA-C  Chief Complaint  Patient presents with  . Diabetes    follow up     Subjective:  Pt presents to clinic for DM med check.  Taking and tolerating his medications.  Did have the pharmacy not double up on his PPI though he has been taking it twice a day, wonders if his prescription was done incorrectly by accident.  Saw urology and was diagnosed with BPH, he is currently on Flomax.  They will continue to monitor his PSA since he had an increase in the last 2 years.  Lantus 20 at night --has to be mindful of how much medications cost. Glucose - not checking regularly - fasting 171 Taking medications  Some sweets but otherwise eating good  Still doing photography shoes.  History is obtained by patient.  Review of Systems  Constitutional: Negative for chills and fever.  Eyes: Negative for visual disturbance.  Respiratory: Negative for cough and shortness of breath.   Cardiovascular: Negative for chest pain, palpitations and leg swelling.  Neurological: Negative for dizziness, light-headedness, numbness and headaches.    Patient Active Problem List   Diagnosis Date Noted  . BPH (benign prostatic hyperplasia) 08/17/2018  . Uncontrolled type 2 diabetes mellitus with diabetic polyneuropathy, with long-term current use of insulin ([place]) 07/13/2017  . HTN (hypertension) 07/13/2017  . Elevated cholesterol 07/13/2017  . Esophagitis 07/13/2017  . History of esophageal stricture 07/13/2017    Current Outpatient Medications on File Prior to Visit  Medication Sig Dispense Refill  . aspirin 81 MG chewable tablet Chew by mouth.    . Cyanocobalamin (B-12) 2500 MCG TABS Take by mouth.    . Insulin Glargine (LANTUS) 100 UNIT/ML Solostar Pen INJECT 20 UNITS SUBCUTANEOUSLY EVERY DAY 15 mL 3  . Insulin Pen Needle (PEN NEEDLES) 31G X 5 MM MISC 2 each by Does not apply route daily. 100 each 6  . liraglutide (VICTOZA) 18  MG/3ML SOPN Inject 0.3 mLs (1.8 mg total) into the skin daily. 9 mL 3  . lisinopril (PRINIVIL,ZESTRIL) 10 MG tablet Take 1 tablet (10 mg total) by mouth daily. 90 tablet 3  . metFORMIN (GLUCOPHAGE) 1000 MG tablet 1 po bid 180 tablet 4  . Multiple Vitamin (THERA) TABS Take by mouth.    . rosuvastatin (CRESTOR) 5 MG tablet Take 1 tablet (5 mg total) by mouth daily. 90 tablet 3  . tamsulosin (FLOMAX) 0.4 MG CAPS capsule Take 1 capsule (0.4 mg total) by mouth daily. 90 capsule 4  . traZODone (DESYREL) 50 MG tablet TAKE 0.5-1 TABLETS (25-50 MG TOTAL) BY MOUTH AT BEDTIME AS NEEDED FOR SLEEP. 90 tablet 2   No current facility-administered medications on file prior to visit.     Allergies  Allergen Reactions  . Codeine     Other reaction(s): Abdominal Pain    Past Medical History:  Diagnosis Date  . Anxiety   . 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   Social History   Tobacco Use  . Smoking status: Former Smoker    Packs/day: 1.00    Years: 25.00    Pack years: 25.00    Last attempt to quit: 07/14/2007    Years since quitting: 11.1  . Smokeless tobacco: Never Used  Substance Use Topics  . Alcohol use: Yes    Comment: social rare  . Drug use: Yes    Types: Marijuana    Comment: social   family history includes Dementia in his mother; Andrew Roberson in his sister; Hypertension in his father; Liver Roberson in his sister; Myelodysplastic syndrome in his father; Prostate cancer in his father; Stroke in his mother.     Objective:  BP 130/62   Pulse 94   Temp 98.6 F (37 C) (Oral)   Resp 18   Ht '5\' 10"'  (1.778 m)   Wt 213 lb 12.8 oz (97 kg)   SpO2 97%   BMI 30.68 kg/m  Body mass index is 30.68 kg/m.  Wt Readings from Last 3 Encounters:  08/17/18 213 lb 12.8 oz (97 kg)  05/14/18 211 lb 9.6 oz (96 kg)  05/04/18 212  lb 6.4 oz (96.3 kg)    Physical Exam  Constitutional: He is oriented to person, place, and time. He appears well-developed and well-nourished.  HENT:  Head: Normocephalic and atraumatic.  Right Ear: External ear normal.  Left Ear: External ear normal.  Eyes: Conjunctivae are normal.  Neck: Normal range of motion.  Cardiovascular: Normal rate, regular rhythm, normal heart sounds and intact distal pulses.  Pulmonary/Chest: Effort normal and breath sounds normal. He has no wheezes.  Musculoskeletal:       Right lower leg: He exhibits no edema.       Left lower leg: He exhibits no edema.  Neurological: He is alert and oriented to person, place, and time.  Skin: Skin is warm and dry.  Psychiatric: He has a normal mood and affect. His behavior is normal. Judgment and thought content normal.  Vitals reviewed.   Assessment and Plan :  Uncontrolled type 2 diabetes mellitus with diabetic polyneuropathy, with long-term current use of insulin ([place]) - Plan: HM Diabetes Foot Exam, CMP14+EGFR, Hemoglobin A1c -check labs patient has medications at the pharmacy already.  If continues to be high did talk to patient about a SLGT2 medication and initiation of that which she is willing as long as affordable.  Benign prostatic hyperplasia with lower urinary tract symptoms, symptom details unspecified -continue Flomax and follow-up as planned with urology  Insomnia, unspecified type -he tried trazodone did not help much but he did not increase the dose.  He will increase his dose by 25 mg every 3 to 4 days until he has a dose of 150 mg then he will contact me.  Flu vaccine need - Plan: Flu Vaccine QUAD 36+ mos IM -given today  Esophagitis - Plan: omeprazole (PRILOSEC) 40 MG capsule -changed to twice daily dosing  Patient verbalized to me that they understand the following: diagnosis, what is being done for them, what to expect and what should be done at home.  Their questions have been answered.  See  after visit summary for patient specific instructions.  Windell Hummingbird PA-C  [facility] 08/17/2018 3:38 PM  Please note: Portions of this report may have been transcribed using dragon voice recognition software. Every effort was made to ensure accuracy; however, inadvertent computerized transcription errors may be present.

## 2018-08-23 ENCOUNTER — Encounter: Payer: Self-pay | Admitting: Physician Assistant

## 2018-08-23 MED ORDER — CANAGLIFLOZIN 100 MG PO TABS: 100.0000 mg | ORAL_TABLET | Freq: Every day | ORAL | 0 refills | Status: DC

## 2018-08-23 NOTE — Addendum Note (Signed)
Addended by: Mancel Bale on: 08/23/2018 09:12 AM   Modules accepted: Orders

## 2018-09-28 ENCOUNTER — Encounter: Payer: Self-pay | Admitting: Physician Assistant

## 2018-10-21 ENCOUNTER — Other Ambulatory Visit: Payer: Self-pay

## 2018-10-21 ENCOUNTER — Encounter: Payer: Self-pay | Admitting: Physician Assistant

## 2018-10-21 ENCOUNTER — Ambulatory Visit (INDEPENDENT_AMBULATORY_CARE_PROVIDER_SITE_OTHER): Payer: PPO | Admitting: Physician Assistant

## 2018-10-21 VITALS — BP 119/74 | HR 76 | Temp 98.5°F | Resp 20 | Ht 70.79 in | Wt 206.8 lb

## 2018-10-21 DIAGNOSIS — R197 Diarrhea, unspecified: Secondary | ICD-10-CM

## 2018-10-21 DIAGNOSIS — G4700 Insomnia, unspecified: Secondary | ICD-10-CM

## 2018-10-21 DIAGNOSIS — B356 Tinea cruris: Secondary | ICD-10-CM

## 2018-10-21 MED ORDER — METFORMIN HCL ER 500 MG PO TB24: 2000.0000 mg | ORAL_TABLET | Freq: Every day | ORAL | 0 refills | Status: DC

## 2018-10-21 MED ORDER — TRAZODONE HCL 50 MG PO TABS: 100.0000 mg | ORAL_TABLET | Freq: Every evening | ORAL | 1 refills | Status: DC | PRN

## 2018-10-21 MED ORDER — FLUCONAZOLE 150 MG PO TABS: 150.0000 mg | ORAL_TABLET | Freq: Once | ORAL | 0 refills | Status: AC

## 2018-10-21 NOTE — Progress Notes (Signed)
Andrew Roberson  MRN: [id number] DOB: 02-12-1951  Subjective:  Andrew Roberson is a 67 y.o. male seen in office today for a chief complaint of f/u on insomnia. Was told to increase trazodone by 25mg  a night but only made it to 100mg  dose for a while then ran out of Rx. Ran out 2 weeks ago. Could notice some improvement with that dose. Has been also trying reading books before bed. Drinks soft drinks throughout the day.    Diarrhea x 2 weeks. When it first started, was in [place] camping, only drank bottled water. Having 3-4 episodes per day, improving.Has tried immodium without much relief.  Denies mucopurulent stools, hematochezia, melena, abdominal pain, nausea, vomiting.  Pepto bismol worked. Ran out of omeprazole a few weeks ago and thinks this triggered as he had a similar episode like this in 04/2018.  No recent antibiotic use.  Had yeast infection in groin for a few days, which got pretty bad after camping.  Could not get a hold of provider at the office to get to refill for Diflucan.  Used clotrimazole cream.  This eventually resolved.  This will typically happen if his diabetes is uncontrolled.  However his sugars have been controlled at this time.  If it gets severe, he does need Diflucan.  Review of Systems  Constitutional: Negative for chills, diaphoresis and fever.      Patient Active Problem List   Diagnosis Date Noted  . BPH (benign prostatic hyperplasia) 08/17/2018  . Uncontrolled type 2 diabetes mellitus with diabetic polyneuropathy, with long-term current use of insulin ([place]) 07/13/2017  . HTN (hypertension) 07/13/2017  . Elevated cholesterol 07/13/2017  . Esophagitis 07/13/2017  . History of esophageal stricture 07/13/2017    Current Outpatient Medications on File Prior to Visit  Medication Sig Dispense Refill  . aspirin 81 MG chewable tablet Chew by mouth.    . canagliflozin (INVOKANA) 100 MG TABS tablet Take 1 tablet (100 mg total) by mouth daily before  breakfast. 90 tablet 0  . Cyanocobalamin (B-12) 2500 MCG TABS Take by mouth.    . Insulin Glargine (LANTUS) 100 UNIT/ML Solostar Pen INJECT 20 UNITS SUBCUTANEOUSLY EVERY DAY 15 mL 3  . Insulin Pen Needle (PEN NEEDLES) 31G X 5 MM MISC 2 each by Does not apply route daily. 100 each 6  . liraglutide (VICTOZA) 18 MG/3ML SOPN Inject 0.3 mLs (1.8 mg total) into the skin daily. 9 mL 3  . lisinopril (PRINIVIL,ZESTRIL) 10 MG tablet Take 1 tablet (10 mg total) by mouth daily. 90 tablet 3  . metFORMIN (GLUCOPHAGE) 1000 MG tablet 1 po bid 180 tablet 4  . Multiple Vitamin (THERA) TABS Take by mouth.    Marland Kitchen omeprazole (PRILOSEC) 40 MG capsule Take 1 capsule (40 mg total) by mouth 2 (two) times daily. 180 capsule 3  . rosuvastatin (CRESTOR) 5 MG tablet Take 1 tablet (5 mg total) by mouth daily. 90 tablet 3  . tamsulosin (FLOMAX) 0.4 MG CAPS capsule Take 1 capsule (0.4 mg total) by mouth daily. 90 capsule 4  . traZODone (DESYREL) 50 MG tablet TAKE 0.5-1 TABLETS (25-50 MG TOTAL) BY MOUTH AT BEDTIME AS NEEDED FOR SLEEP. 90 tablet 2   No current facility-administered medications on file prior to visit.     Allergies  Allergen Reactions  . Codeine     Other reaction(s): Abdominal Pain     Objective:  BP 119/74   Pulse 76   Temp 98.5 F (36.9 C) (Oral)  Resp 20   Ht 5' 10.79" (1.798 m)   Wt 206 lb 12.8 oz (93.8 kg)   SpO2 98%   BMI 29.02 kg/m   Physical Exam  Constitutional: He is oriented to person, place, and time. He appears well-developed and well-nourished. No distress.  HENT:  Head: Normocephalic and atraumatic.  Eyes: Conjunctivae are normal.  Neck: Normal range of motion.  Cardiovascular: Normal rate, regular rhythm, normal heart sounds and intact distal pulses.  Pulmonary/Chest: Effort normal.  Abdominal: Soft. Normal appearance and bowel sounds are normal. There is no hepatosplenomegaly. There is no tenderness.  Neurological: He is alert and oriented to person, place, and time.  Skin:  Skin is warm and dry.  Psychiatric: He has a normal mood and affect.  Vitals reviewed.   Assessment and Plan :  1. Insomnia, unspecified type Refills provided.  Educated on dosing of trazodone for insomnia.  Follow-up as needed. - traZODone (DESYREL) 50 MG tablet; Take 2 tablets (100 mg total) by mouth at bedtime as needed for sleep.  Dispense: 270 tablet; Refill: 1  2. Diarrhea, unspecified type Resolving.  No acute findings noted on exam.  Patient declines further work-up at this time but just wanted to mention it in office.  Do suggest switching metformin IR to XR as this may be contributing to symptoms.  Suggest obtaining stool profile if symptoms do not fully resolve over the next few days.  Patient agrees. - metFORMIN (GLUCOPHAGE-XR) 500 MG 24 hr tablet; Take 4 tablets (2,000 mg total) by mouth daily with breakfast.  Dispense: 360 tablet; Refill: 0  3. Tinea cruris Asymptomatic at this time however will provide patient with tablet of Diflucan in case it gets to the severity it has in the past with topical use of clotrimazole. - fluconazole (DIFLUCAN) 150 MG tablet; Take 1 tablet (150 mg total) by mouth once for 1 dose. Repeat if needed  Dispense: 2 tablet; Refill: 0  Side effects, risks, benefits, and alternatives of the medications and treatment plan prescribed today were discussed, and patient expressed understanding of the instructions given. No barriers to understanding were identified. Red flags discussed in detail. Pt expressed understanding regarding what to do in case of emergency/urgent symptoms.  Tenna Delaine PA-C  [facility] 10/21/2018 10:45 AM

## 2018-10-21 NOTE — Patient Instructions (Addendum)
To start the Trazodone - start with 1 pill at night for the 1st 4 nights - increase the dose by 25mg  (1/2 pill) every 4-5 nights until either you sleep well, have side effects or reach a nightly dose of 150mg   Continue eating bland diet until diarrhea fully resolved. I suggest returning to office for stool sample if persists after next 3-5 days. If you get any pain, fever, nausea, or vomiting, please be soon. You can change to metformin XR if needed.   For future yeast infections, diflucan is at your pharmacy.    If you have lab work done today you will be contacted with your lab results within the next 2 weeks.  If you have not heard from [place] then please contact us. The fastest way to get your results is to register for My Chart.  Diarrhea, Adult Diarrhea is when you have loose and water poop (stool) often. Diarrhea can make you feel weak and cause you to get dehydrated. Dehydration can make you tired and thirsty, make you have a dry mouth, and make it so you pee (urinate) less often. Diarrhea often lasts 2-3 days. However, it can last longer if it is a sign of something more serious. It is important to treat your diarrhea as told by your doctor. Follow these instructions at home: Eating and drinking  Follow these recommendations as told by your doctor:  Take an oral rehydration solution (ORS). This is a drink that is sold at pharmacies and stores.  Drink clear fluids, such as: ? Water. ? Ice chips. ? Diluted fruit juice. ? Low-calorie sports drinks.  Eat bland, easy-to-digest foods in small amounts as you are able. These foods include: ? Bananas. ? Applesauce. ? Rice. ? Low-fat (lean) meats. ? Toast. ? Crackers.  Avoid drinking fluids that have a lot of sugar or caffeine in them.  Avoid alcohol.  Avoid spicy or fatty foods.  General instructions   Drink enough fluid to keep your pee (urine) clear or pale yellow.  Wash your hands often. If you cannot use soap and water,  use hand sanitizer.  Make sure that all people in your home wash their hands well and often.  Take over-the-counter and prescription medicines only as told by your doctor.  Rest at home while you get better.  Watch your condition for any changes.  Take a warm bath to help with any burning or pain from having diarrhea.  Keep all follow-up visits as told by your doctor. This is important. Contact a doctor if:  You have a fever.  Your diarrhea gets worse.  You have new symptoms.  You cannot keep fluids down.  You feel light-headed or dizzy.  You have a headache.  You have muscle cramps. Get help right away if:  You have chest pain.  You feel very weak or you pass out (faint).  You have bloody or black poop or poop that look like tar.  You have very bad pain, cramping, or bloating in your belly (abdomen).  You have trouble breathing or you are breathing very quickly.  Your heart is beating very quickly.  Your skin feels cold and clammy.  You feel confused.  You have signs of dehydration, such as: ? Dark pee, hardly any pee, or no pee. ? Cracked lips. ? Dry mouth. ? Sunken eyes. ? Sleepiness. ? Weakness. This information is not intended to replace advice given to you by your health care provider. Make sure you discuss any questions  you have with your health care provider. Document Released: 06/02/2008 Document Revised: 07/04/2016 Document Reviewed: 08/21/2015 Elsevier Interactive Patient Education  2018 [employer].   IF you received an x-ray today, you will receive an invoice from [facility]. Please contact [facility] at [phone] with questions or concerns regarding your invoice.   IF you received labwork today, you will receive an invoice from [place]. Please contact [facility] at [phone] with questions or concerns regarding your invoice.   Our billing staff will not be able to assist you with questions regarding bills from  these companies.  You will be contacted with the lab results as soon as they are available. The fastest way to get your results is to activate your My Chart account. Instructions are located on the last page of this paperwork. If you have not heard from [place] regarding the results in 2 weeks, please contact this office.

## 2018-10-26 ENCOUNTER — Encounter: Payer: Self-pay | Admitting: Physician Assistant

## 2018-11-02 ENCOUNTER — Encounter: Payer: Self-pay | Admitting: Physician Assistant

## 2018-11-03 ENCOUNTER — Other Ambulatory Visit: Payer: Self-pay | Admitting: Physician Assistant

## 2018-11-03 DIAGNOSIS — Z794 Long term (current) use of insulin: Principal | ICD-10-CM

## 2018-11-03 DIAGNOSIS — E1142 Type 2 diabetes mellitus with diabetic polyneuropathy: Secondary | ICD-10-CM

## 2018-11-03 DIAGNOSIS — E1165 Type 2 diabetes mellitus with hyperglycemia: Principal | ICD-10-CM

## 2018-11-03 DIAGNOSIS — IMO0002 Reserved for concepts with insufficient information to code with codable children: Secondary | ICD-10-CM

## 2018-11-03 MED ORDER — CANAGLIFLOZIN 100 MG PO TABS: 100.0000 mg | ORAL_TABLET | Freq: Every day | ORAL | 0 refills | Status: DC

## 2018-11-03 NOTE — Progress Notes (Signed)
Meds ordered this encounter  Medications  . canagliflozin (INVOKANA) 100 MG TABS tablet    Sig: Take 1 tablet (100 mg total) by mouth daily before breakfast.    Dispense:  90 tablet    Refill:  0    Order Specific Question:   Supervising Provider    Answer:   Horald Pollen [phone]

## 2018-11-09 ENCOUNTER — Encounter: Payer: Self-pay | Admitting: Physician Assistant

## 2018-11-17 ENCOUNTER — Ambulatory Visit (INDEPENDENT_AMBULATORY_CARE_PROVIDER_SITE_OTHER): Payer: PPO | Admitting: Physician Assistant

## 2018-11-17 ENCOUNTER — Other Ambulatory Visit: Payer: Self-pay

## 2018-11-17 ENCOUNTER — Encounter: Payer: Self-pay | Admitting: Physician Assistant

## 2018-11-17 VITALS — BP 124/73 | HR 71 | Temp 98.0°F | Resp 20 | Ht 70.79 in | Wt 208.8 lb

## 2018-11-17 DIAGNOSIS — Z23 Encounter for immunization: Secondary | ICD-10-CM | POA: Diagnosis not present

## 2018-11-17 DIAGNOSIS — E1165 Type 2 diabetes mellitus with hyperglycemia: Secondary | ICD-10-CM | POA: Diagnosis not present

## 2018-11-17 DIAGNOSIS — Z794 Long term (current) use of insulin: Secondary | ICD-10-CM | POA: Diagnosis not present

## 2018-11-17 DIAGNOSIS — IMO0002 Reserved for concepts with insufficient information to code with codable children: Secondary | ICD-10-CM

## 2018-11-17 DIAGNOSIS — E1142 Type 2 diabetes mellitus with diabetic polyneuropathy: Secondary | ICD-10-CM | POA: Diagnosis not present

## 2018-11-17 LAB — POC MICROSCOPIC URINALYSIS (UMFC): Mucus: ABSENT

## 2018-11-17 LAB — POCT GLYCOSYLATED HEMOGLOBIN (HGB A1C): Hemoglobin A1C: 7.3 % — AB (ref 4.0–5.6)

## 2018-11-17 MED ORDER — METFORMIN HCL ER 500 MG PO TB24: 2000.0000 mg | ORAL_TABLET | Freq: Every day | ORAL | 0 refills | Status: DC

## 2018-11-17 MED ORDER — CANAGLIFLOZIN 300 MG PO TABS: 300.0000 mg | ORAL_TABLET | Freq: Every day | ORAL | 0 refills | Status: DC

## 2018-11-17 NOTE — Progress Notes (Signed)
MRN: [id number]  Subjective:   Andrew Roberson is a 67 y.o. male who presents for follow up of Type 2 diabetes mellitus. Diagnosis was made 2011. Patient is currently managed with  invokana '100mg'$ , lantus 20 units qhs, vitoza, metformin XR 2000 mg daily. Admits good compliance. Denies adverse effects including metallic taste, hypoglycemia, nausea, vomiting.Patient is checking home blood sugars. Home blood sugar records: BGs range between 85 and 165. Current symptoms include paresthesia of the feet, has had this for years. Patient denies foot ulcerations, increased appetite, nausea,  polydipsia, polyuria, visual disturbances, vomiting and weight loss. Patient is checking their feet daily. No foot concerns. Last diabetic eye exam eye exam 01/27/2018. Diet: meat, vegetables, and some carbs. Drinks mostly diet sodas, some water. Exercise includes walking. Denies smoking. Some alcohol use.   Known diabetic complications: peripheral neuropathy  Immunizations: Flu vaccine 07/2018 , shingles: received 1st dose, pneumococal vaccine: prevnar 13 2018  Needs refill for lisinopril.   ROS per HPI  Social History   Socioeconomic History  . Marital status: Single    Spouse name: Not on file  . Number of children: 0  . Years of education: Not on file  . Highest education level: Not on file  Occupational History  . Occupation: retired  Scientific laboratory technician  . Financial resource strain: Not on file  . Food insecurity:    Worry: Not on file    Inability: Not on file  . Transportation needs:    Medical: Not on file    Non-medical: Not on file  Tobacco Use  . Smoking status: Former Smoker    Packs/day: 1.00    Years: 25.00    Pack years: 25.00    Last attempt to quit: 07/14/2007    Years since quitting: 11.3  . Smokeless tobacco: Never Used  Substance and Sexual Activity  . Alcohol use: Yes    Comment: social rare  . Drug use: Yes    Types: Marijuana    Comment: social  . Sexual activity: Not  Currently  Lifestyle  . Physical activity:    Days per week: Not on file    Minutes per session: Not on file  . Stress: Not on file  Relationships  . Social connections:    Talks on phone: Not on file    Gets together: Not on file    Attends religious service: Not on file    Active member of club or organization: Not on file    Attends meetings of clubs or organizations: Not on file    Relationship status: Not on file  . Intimate partner violence:    Fear of current or ex partner: Not on file    Emotionally abused: Not on file    Physically abused: Not on file    Forced sexual activity: Not on file  Other Topics Concern  . 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      Objective:   PHYSICAL EXAM BP 124/73   Pulse 71   Temp 98 F (36.7 C) (Oral)   Resp 20   Ht 5' 10.79" (1.798 m)   Wt 208 lb 12.8 oz (94.7 kg)   SpO2 97%   BMI 29.30 kg/m   Physical Exam  Constitutional: He is oriented to person, place, and time. He appears well-developed and well-nourished. No distress.  HENT:  Head: Normocephalic and atraumatic.  Mouth/Throat: Uvula is midline, oropharynx is clear and moist and mucous membranes are normal. No tonsillar exudate.  Eyes: Pupils are equal, round, and reactive to light. Conjunctivae, EOM and lids are normal.  Neck: Normal range of motion.  Cardiovascular: Normal rate, regular rhythm, normal heart sounds and intact distal pulses.  Pulmonary/Chest: Effort normal.  Abdominal: Soft. Normal appearance and bowel sounds are normal. There is no tenderness.  Musculoskeletal:       Right lower leg: He exhibits no edema.       Left lower leg: He exhibits no edema.  Neurological: He is alert and oriented to person, place, and time.  Skin: Skin is warm and dry.  Psychiatric: He has a normal mood and affect.  Vitals reviewed.    Results for orders placed or  performed in visit on 11/17/18 (from the past 24 hour(s))  POCT glycosylated hemoglobin (Hb A1C)     Status: Abnormal   Collection Time: 11/17/18 11:11 AM  Result Value Ref Range   Hemoglobin A1C 7.3 (A) 4.0 - 5.6 %   HbA1c POC (<> result, manual entry)     HbA1c, POC (prediabetic range)     HbA1c, POC (controlled diabetic range)    POCT Microscopic Urinalysis ([facility])     Status: None   Collection Time: 11/17/18 11:22 AM  Result Value Ref Range   WBC,UR,HPF,POC None None WBC/hpf   RBC,UR,HPF,POC None None RBC/hpf   Bacteria None None, Too numerous to count   Mucus Absent Absent   Epithelial Cells, UR Per Microscopy None None, Too numerous to count cells/hpf    Assessment and Plan :  1. Uncontrolled type 2 diabetes mellitus with diabetic polyneuropathy, with long-term current use of insulin ([facility]) A1C has improved from 8.7 three months ago to 7.3 today with addition of invokana '100mg'$ . Goal is <7.0.  Unfortunately, patient's insurance no longer covers the Invokana 100 mg tablet but will cover the 300 mg tablets.  It does not cover any other SGLT2 inhibitors.  Given Rx for Invokana 300 mg tablets.  Educated patient that he should decrease Lantus to 18 units at night while starting the increase in [place].  If fasting blood sugar is above goal, can go back to 20 units per night.  Educated on potential signs of hypoglycemia.  Follow-up in 3 months for reevaluation. - CBC with Differential/Platelet - CMP14+EGFR - POCT Microscopic Urinalysis ([facility]) - POCT glycosylated hemoglobin (Hb A1C) - Microalbumin/Creatinine Ratio, Urine - TSH - Pneumococcal polysaccharide vaccine 23-valent greater than or equal to 2yo subcutaneous/IM - canagliflozin (INVOKANA) 300 MG TABS tablet; Take 1 tablet (300 mg total) by mouth daily before breakfast.  Dispense: 90 tablet; Refill: 0 - metFORMIN (GLUCOPHAGE-XR) 500 MG 24 hr tablet; Take 4 tablets (2,000 mg total) by mouth daily with breakfast.  Dispense: 360 tablet;  Refill: 0  Tenna Delaine, PA-C  [facility] 11/17/2018 11:25 AM

## 2018-11-17 NOTE — Patient Instructions (Addendum)
It appears that your insurance only covers invokana 300mg  daily.  I suggest taking this along with your other medications for diabetes. You may want to decrease lantus to 18 units a night for the first week while taking the higher dose of invokana to ensure you do not have too much a drop in sugar. Fasting blood sugar goal is 80-100. If you go below that you can continue decreasing lantus by 2 units. If you ever go below 70, please contact our office. Signs of hypoglycemia are lightheadedness, blurred vision, headache, and confusion.  Follow up in 3 months for reevaluation.  Thank you for letting me participate in your health and well being.    If you have lab work done today you will be contacted with your lab results within the next 2 weeks.  If you have not heard from [place] then please contact us. The fastest way to get your results is to register for My Chart.   IF you received an x-ray today, you will receive an invoice from [facility]. Please contact [facility] at [phone] with questions or concerns regarding your invoice.   IF you received labwork today, you will receive an invoice from [place]. Please contact [facility] at [phone] with questions or concerns regarding your invoice.   Our billing staff will not be able to assist you with questions regarding bills from these companies.  You will be contacted with the lab results as soon as they are available. The fastest way to get your results is to activate your My Chart account. Instructions are located on the last page of this paperwork. If you have not heard from [place] regarding the results in 2 weeks, please contact this office.

## 2018-11-18 LAB — MICROALBUMIN / CREATININE URINE RATIO
Creatinine, Urine: 76.6 mg/dL
Microalb/Creat Ratio: 13.4 mg/g creat (ref 0.0–30.0)
Microalbumin, Urine: 10.3 ug/mL

## 2018-11-18 LAB — CMP14+EGFR
ALT: 11 IU/L (ref 0–44)
AST: 10 IU/L (ref 0–40)
Albumin/Globulin Ratio: 1.8 (ref 1.2–2.2)
Albumin: 4.4 g/dL (ref 3.6–4.8)
Alkaline Phosphatase: 79 IU/L (ref 39–117)
BUN/Creatinine Ratio: 20 (ref 10–24)
BUN: 22 mg/dL (ref 8–27)
Bilirubin Total: 0.4 mg/dL (ref 0.0–1.2)
CO2: 21 mmol/L (ref 20–29)
Calcium: 9.9 mg/dL (ref 8.6–10.2)
Chloride: 97 mmol/L (ref 96–106)
Creatinine, Ser: 1.1 mg/dL (ref 0.76–1.27)
GFR calc Af Amer: 80 mL/min/{1.73_m2} (ref >59)
GFR calc non Af Amer: 70 mL/min/{1.73_m2} (ref >59)
Globulin, Total: 2.4 g/dL (ref 1.5–4.5)
Glucose: 107 mg/dL — ABNORMAL HIGH (ref 65–99)
Potassium: 4.5 mmol/L (ref 3.5–5.2)
Sodium: 136 mmol/L (ref 134–144)
Total Protein: 6.8 g/dL (ref 6.0–8.5)

## 2018-11-18 LAB — CBC WITH DIFFERENTIAL/PLATELET
Basophils Absolute: 0.1 10*3/uL (ref 0.0–0.2)
Basos: 1 %
EOS (ABSOLUTE): 1.3 10*3/uL — ABNORMAL HIGH (ref 0.0–0.4)
Eos: 11 %
Hematocrit: 43.5 % (ref 37.5–51.0)
Hemoglobin: 14.4 g/dL (ref 13.0–17.7)
Immature Grans (Abs): 0 10*3/uL (ref 0.0–0.1)
Immature Granulocytes: 0 %
Lymphocytes Absolute: 3 10*3/uL (ref 0.7–3.1)
Lymphs: 26 %
MCH: 28.7 pg (ref 26.6–33.0)
MCHC: 33.1 g/dL (ref 31.5–35.7)
MCV: 87 fL (ref 79–97)
Monocytes Absolute: 1 10*3/uL — ABNORMAL HIGH (ref 0.1–0.9)
Monocytes: 9 %
Neutrophils Absolute: 6 10*3/uL (ref 1.4–7.0)
Neutrophils: 53 %
Platelets: 321 10*3/uL (ref 150–450)
RBC: 5.01 x10E6/uL (ref 4.14–5.80)
RDW: 13 % (ref 12.3–15.4)
WBC: 11.4 10*3/uL — ABNORMAL HIGH (ref 3.4–10.8)

## 2018-11-18 LAB — TSH: TSH: 0.892 u[IU]/mL (ref 0.450–4.500)

## 2019-01-10 DIAGNOSIS — R972 Elevated prostate specific antigen [PSA]: Secondary | ICD-10-CM | POA: Diagnosis not present

## 2019-01-18 DIAGNOSIS — R972 Elevated prostate specific antigen [PSA]: Secondary | ICD-10-CM | POA: Diagnosis not present

## 2019-01-18 DIAGNOSIS — N401 Enlarged prostate with lower urinary tract symptoms: Secondary | ICD-10-CM | POA: Diagnosis not present

## 2019-01-18 DIAGNOSIS — R351 Nocturia: Secondary | ICD-10-CM | POA: Diagnosis not present

## 2019-02-02 DIAGNOSIS — E119 Type 2 diabetes mellitus without complications: Secondary | ICD-10-CM | POA: Diagnosis not present

## 2019-02-02 DIAGNOSIS — H1851 Endothelial corneal dystrophy: Secondary | ICD-10-CM | POA: Diagnosis not present

## 2019-02-02 DIAGNOSIS — H04123 Dry eye syndrome of bilateral lacrimal glands: Secondary | ICD-10-CM | POA: Diagnosis not present

## 2019-02-02 DIAGNOSIS — H17822 Peripheral opacity of cornea, left eye: Secondary | ICD-10-CM | POA: Diagnosis not present

## 2019-02-02 DIAGNOSIS — H2513 Age-related nuclear cataract, bilateral: Secondary | ICD-10-CM | POA: Diagnosis not present

## 2019-02-02 DIAGNOSIS — H43811 Vitreous degeneration, right eye: Secondary | ICD-10-CM | POA: Diagnosis not present

## 2019-02-13 ENCOUNTER — Other Ambulatory Visit: Payer: Self-pay | Admitting: Physician Assistant

## 2019-02-13 DIAGNOSIS — IMO0002 Reserved for concepts with insufficient information to code with codable children: Secondary | ICD-10-CM

## 2019-02-13 DIAGNOSIS — Z794 Long term (current) use of insulin: Principal | ICD-10-CM

## 2019-02-13 DIAGNOSIS — E1142 Type 2 diabetes mellitus with diabetic polyneuropathy: Secondary | ICD-10-CM

## 2019-02-13 DIAGNOSIS — E1165 Type 2 diabetes mellitus with hyperglycemia: Principal | ICD-10-CM

## 2019-02-17 DIAGNOSIS — E1165 Type 2 diabetes mellitus with hyperglycemia: Secondary | ICD-10-CM | POA: Diagnosis not present

## 2019-02-17 DIAGNOSIS — L729 Follicular cyst of the skin and subcutaneous tissue, unspecified: Secondary | ICD-10-CM | POA: Diagnosis not present

## 2019-02-17 DIAGNOSIS — I10 Essential (primary) hypertension: Secondary | ICD-10-CM | POA: Diagnosis not present

## 2019-02-17 DIAGNOSIS — Z8719 Personal history of other diseases of the digestive system: Secondary | ICD-10-CM | POA: Diagnosis not present

## 2019-02-17 DIAGNOSIS — Z87891 Personal history of nicotine dependence: Secondary | ICD-10-CM | POA: Diagnosis not present

## 2019-02-17 DIAGNOSIS — K209 Esophagitis, unspecified: Secondary | ICD-10-CM | POA: Diagnosis not present

## 2019-02-17 DIAGNOSIS — G479 Sleep disorder, unspecified: Secondary | ICD-10-CM | POA: Diagnosis not present

## 2019-02-17 DIAGNOSIS — N400 Enlarged prostate without lower urinary tract symptoms: Secondary | ICD-10-CM | POA: Diagnosis not present

## 2019-02-17 DIAGNOSIS — Z794 Long term (current) use of insulin: Secondary | ICD-10-CM | POA: Diagnosis not present

## 2019-02-17 DIAGNOSIS — E7800 Pure hypercholesterolemia, unspecified: Secondary | ICD-10-CM | POA: Diagnosis not present

## 2019-02-17 DIAGNOSIS — E1142 Type 2 diabetes mellitus with diabetic polyneuropathy: Secondary | ICD-10-CM | POA: Diagnosis not present

## 2019-02-22 ENCOUNTER — Other Ambulatory Visit: Payer: Self-pay | Admitting: Physician Assistant

## 2019-02-22 DIAGNOSIS — Z87891 Personal history of nicotine dependence: Secondary | ICD-10-CM

## 2019-02-24 ENCOUNTER — Ambulatory Visit
Admission: RE | Admit: 2019-02-24 | Discharge: 2019-02-24 | Disposition: A | Discharge status: Home / Self Care | Payer: Self-pay | Source: Ambulatory Visit | Attending: Physician Assistant | Admitting: Physician Assistant

## 2019-02-24 ENCOUNTER — Other Ambulatory Visit: Payer: Self-pay

## 2019-02-24 DIAGNOSIS — Z87891 Personal history of nicotine dependence: Secondary | ICD-10-CM

## 2019-02-24 DIAGNOSIS — Z136 Encounter for screening for cardiovascular disorders: Secondary | ICD-10-CM | POA: Diagnosis not present

## 2019-05-19 DIAGNOSIS — E1165 Type 2 diabetes mellitus with hyperglycemia: Secondary | ICD-10-CM | POA: Diagnosis not present

## 2019-05-19 DIAGNOSIS — Z8719 Personal history of other diseases of the digestive system: Secondary | ICD-10-CM | POA: Diagnosis not present

## 2019-05-19 DIAGNOSIS — E1142 Type 2 diabetes mellitus with diabetic polyneuropathy: Secondary | ICD-10-CM | POA: Diagnosis not present

## 2019-05-19 DIAGNOSIS — N400 Enlarged prostate without lower urinary tract symptoms: Secondary | ICD-10-CM | POA: Diagnosis not present

## 2019-05-19 DIAGNOSIS — I10 Essential (primary) hypertension: Secondary | ICD-10-CM | POA: Diagnosis not present

## 2019-05-19 DIAGNOSIS — R21 Rash and other nonspecific skin eruption: Secondary | ICD-10-CM | POA: Diagnosis not present

## 2019-05-19 DIAGNOSIS — G479 Sleep disorder, unspecified: Secondary | ICD-10-CM | POA: Diagnosis not present

## 2019-05-19 DIAGNOSIS — Z794 Long term (current) use of insulin: Secondary | ICD-10-CM | POA: Diagnosis not present

## 2019-05-19 DIAGNOSIS — E7800 Pure hypercholesterolemia, unspecified: Secondary | ICD-10-CM | POA: Diagnosis not present

## 2019-06-30 DIAGNOSIS — R972 Elevated prostate specific antigen [PSA]: Secondary | ICD-10-CM | POA: Diagnosis not present

## 2019-07-08 DIAGNOSIS — R972 Elevated prostate specific antigen [PSA]: Secondary | ICD-10-CM | POA: Diagnosis not present

## 2019-07-08 DIAGNOSIS — N401 Enlarged prostate with lower urinary tract symptoms: Secondary | ICD-10-CM | POA: Diagnosis not present

## 2019-07-08 DIAGNOSIS — R351 Nocturia: Secondary | ICD-10-CM | POA: Diagnosis not present

## 2019-07-21 ENCOUNTER — Other Ambulatory Visit: Payer: Self-pay | Admitting: Physician Assistant

## 2019-07-21 DIAGNOSIS — D1801 Hemangioma of skin and subcutaneous tissue: Secondary | ICD-10-CM | POA: Diagnosis not present

## 2019-07-21 DIAGNOSIS — IMO0002 Reserved for concepts with insufficient information to code with codable children: Secondary | ICD-10-CM

## 2019-07-21 DIAGNOSIS — E1142 Type 2 diabetes mellitus with diabetic polyneuropathy: Secondary | ICD-10-CM

## 2019-07-21 DIAGNOSIS — Z85828 Personal history of other malignant neoplasm of skin: Secondary | ICD-10-CM | POA: Diagnosis not present

## 2019-07-21 DIAGNOSIS — M7130 Other bursal cyst, unspecified site: Secondary | ICD-10-CM | POA: Diagnosis not present

## 2019-07-21 DIAGNOSIS — L918 Other hypertrophic disorders of the skin: Secondary | ICD-10-CM | POA: Diagnosis not present

## 2019-07-21 DIAGNOSIS — L821 Other seborrheic keratosis: Secondary | ICD-10-CM | POA: Diagnosis not present

## 2019-07-21 DIAGNOSIS — L720 Epidermal cyst: Secondary | ICD-10-CM | POA: Diagnosis not present

## 2019-07-22 NOTE — Telephone Encounter (Signed)
Patient needs an OV for additional refills. 

## 2019-08-12 DIAGNOSIS — Z794 Long term (current) use of insulin: Secondary | ICD-10-CM | POA: Diagnosis not present

## 2019-08-12 DIAGNOSIS — E1165 Type 2 diabetes mellitus with hyperglycemia: Secondary | ICD-10-CM | POA: Diagnosis not present

## 2019-08-12 DIAGNOSIS — I10 Essential (primary) hypertension: Secondary | ICD-10-CM | POA: Diagnosis not present

## 2019-08-12 DIAGNOSIS — E1142 Type 2 diabetes mellitus with diabetic polyneuropathy: Secondary | ICD-10-CM | POA: Diagnosis not present

## 2019-08-19 DIAGNOSIS — H6123 Impacted cerumen, bilateral: Secondary | ICD-10-CM | POA: Diagnosis not present

## 2019-08-19 DIAGNOSIS — N400 Enlarged prostate without lower urinary tract symptoms: Secondary | ICD-10-CM | POA: Diagnosis not present

## 2019-08-19 DIAGNOSIS — Z794 Long term (current) use of insulin: Secondary | ICD-10-CM | POA: Diagnosis not present

## 2019-08-19 DIAGNOSIS — E1142 Type 2 diabetes mellitus with diabetic polyneuropathy: Secondary | ICD-10-CM | POA: Diagnosis not present

## 2019-08-19 DIAGNOSIS — E7800 Pure hypercholesterolemia, unspecified: Secondary | ICD-10-CM | POA: Diagnosis not present

## 2019-08-19 DIAGNOSIS — E1165 Type 2 diabetes mellitus with hyperglycemia: Secondary | ICD-10-CM | POA: Diagnosis not present

## 2019-08-19 DIAGNOSIS — I10 Essential (primary) hypertension: Secondary | ICD-10-CM | POA: Diagnosis not present

## 2019-08-31 NOTE — Telephone Encounter (Signed)
See's Andrew Roberson at [facility]

## 2019-09-20 DIAGNOSIS — Z20828 Contact with and (suspected) exposure to other viral communicable diseases: Secondary | ICD-10-CM | POA: Diagnosis not present

## 2019-11-16 DIAGNOSIS — I10 Essential (primary) hypertension: Secondary | ICD-10-CM | POA: Diagnosis not present

## 2019-11-16 DIAGNOSIS — N400 Enlarged prostate without lower urinary tract symptoms: Secondary | ICD-10-CM | POA: Diagnosis not present

## 2019-11-16 DIAGNOSIS — E1165 Type 2 diabetes mellitus with hyperglycemia: Secondary | ICD-10-CM | POA: Diagnosis not present

## 2019-11-16 DIAGNOSIS — Z794 Long term (current) use of insulin: Secondary | ICD-10-CM | POA: Diagnosis not present

## 2019-11-16 DIAGNOSIS — E1142 Type 2 diabetes mellitus with diabetic polyneuropathy: Secondary | ICD-10-CM | POA: Diagnosis not present

## 2019-11-21 DIAGNOSIS — E1169 Type 2 diabetes mellitus with other specified complication: Secondary | ICD-10-CM | POA: Diagnosis not present

## 2019-11-21 DIAGNOSIS — Z87891 Personal history of nicotine dependence: Secondary | ICD-10-CM | POA: Diagnosis not present

## 2019-11-21 DIAGNOSIS — Z794 Long term (current) use of insulin: Secondary | ICD-10-CM | POA: Diagnosis not present

## 2019-11-21 DIAGNOSIS — H6123 Impacted cerumen, bilateral: Secondary | ICD-10-CM | POA: Diagnosis not present

## 2019-11-21 DIAGNOSIS — Z0000 Encounter for general adult medical examination without abnormal findings: Secondary | ICD-10-CM | POA: Diagnosis not present

## 2019-11-21 DIAGNOSIS — I10 Essential (primary) hypertension: Secondary | ICD-10-CM | POA: Diagnosis not present

## 2019-11-21 DIAGNOSIS — E785 Hyperlipidemia, unspecified: Secondary | ICD-10-CM | POA: Diagnosis not present

## 2019-11-21 DIAGNOSIS — E1142 Type 2 diabetes mellitus with diabetic polyneuropathy: Secondary | ICD-10-CM | POA: Diagnosis not present

## 2019-11-21 DIAGNOSIS — E1165 Type 2 diabetes mellitus with hyperglycemia: Secondary | ICD-10-CM | POA: Diagnosis not present

## 2019-11-21 DIAGNOSIS — G479 Sleep disorder, unspecified: Secondary | ICD-10-CM | POA: Diagnosis not present

## 2019-12-05 DIAGNOSIS — H6123 Impacted cerumen, bilateral: Secondary | ICD-10-CM | POA: Diagnosis not present

## 2019-12-05 DIAGNOSIS — Z0000 Encounter for general adult medical examination without abnormal findings: Secondary | ICD-10-CM | POA: Diagnosis not present

## 2020-01-26 ENCOUNTER — Other Ambulatory Visit: Payer: Self-pay | Admitting: Physician Assistant

## 2020-01-26 DIAGNOSIS — Z87891 Personal history of nicotine dependence: Secondary | ICD-10-CM

## 2020-02-03 DIAGNOSIS — E119 Type 2 diabetes mellitus without complications: Secondary | ICD-10-CM | POA: Diagnosis not present

## 2020-02-03 DIAGNOSIS — H04123 Dry eye syndrome of bilateral lacrimal glands: Secondary | ICD-10-CM | POA: Diagnosis not present

## 2020-02-03 DIAGNOSIS — H43813 Vitreous degeneration, bilateral: Secondary | ICD-10-CM | POA: Diagnosis not present

## 2020-02-03 DIAGNOSIS — H2513 Age-related nuclear cataract, bilateral: Secondary | ICD-10-CM | POA: Diagnosis not present

## 2020-02-03 DIAGNOSIS — H18513 Endothelial corneal dystrophy, bilateral: Secondary | ICD-10-CM | POA: Diagnosis not present

## 2020-02-03 DIAGNOSIS — H17822 Peripheral opacity of cornea, left eye: Secondary | ICD-10-CM | POA: Diagnosis not present

## 2020-02-09 ENCOUNTER — Ambulatory Visit
Admission: RE | Admit: 2020-02-09 | Discharge: 2020-02-09 | Disposition: A | Discharge status: Home / Self Care | Payer: PPO | Source: Ambulatory Visit | Attending: Physician Assistant | Admitting: Physician Assistant

## 2020-02-09 DIAGNOSIS — Z87891 Personal history of nicotine dependence: Secondary | ICD-10-CM | POA: Diagnosis not present

## 2020-02-14 DIAGNOSIS — N400 Enlarged prostate without lower urinary tract symptoms: Secondary | ICD-10-CM | POA: Diagnosis not present

## 2020-02-14 DIAGNOSIS — E1169 Type 2 diabetes mellitus with other specified complication: Secondary | ICD-10-CM | POA: Diagnosis not present

## 2020-02-14 DIAGNOSIS — E1142 Type 2 diabetes mellitus with diabetic polyneuropathy: Secondary | ICD-10-CM | POA: Diagnosis not present

## 2020-02-14 DIAGNOSIS — Z794 Long term (current) use of insulin: Secondary | ICD-10-CM | POA: Diagnosis not present

## 2020-02-14 DIAGNOSIS — E785 Hyperlipidemia, unspecified: Secondary | ICD-10-CM | POA: Diagnosis not present

## 2020-02-14 DIAGNOSIS — E1165 Type 2 diabetes mellitus with hyperglycemia: Secondary | ICD-10-CM | POA: Diagnosis not present

## 2020-02-21 DIAGNOSIS — E7800 Pure hypercholesterolemia, unspecified: Secondary | ICD-10-CM | POA: Diagnosis not present

## 2020-02-21 DIAGNOSIS — E1142 Type 2 diabetes mellitus with diabetic polyneuropathy: Secondary | ICD-10-CM | POA: Diagnosis not present

## 2020-02-21 DIAGNOSIS — E1159 Type 2 diabetes mellitus with other circulatory complications: Secondary | ICD-10-CM | POA: Diagnosis not present

## 2020-02-21 DIAGNOSIS — Z794 Long term (current) use of insulin: Secondary | ICD-10-CM | POA: Diagnosis not present

## 2020-02-21 DIAGNOSIS — E785 Hyperlipidemia, unspecified: Secondary | ICD-10-CM | POA: Diagnosis not present

## 2020-02-21 DIAGNOSIS — I10 Essential (primary) hypertension: Secondary | ICD-10-CM | POA: Diagnosis not present

## 2020-02-21 DIAGNOSIS — E1169 Type 2 diabetes mellitus with other specified complication: Secondary | ICD-10-CM | POA: Diagnosis not present

## 2020-02-21 DIAGNOSIS — E1165 Type 2 diabetes mellitus with hyperglycemia: Secondary | ICD-10-CM | POA: Diagnosis not present

## 2020-02-21 DIAGNOSIS — N400 Enlarged prostate without lower urinary tract symptoms: Secondary | ICD-10-CM | POA: Diagnosis not present

## 2020-02-28 DIAGNOSIS — I77811 Abdominal aortic ectasia: Secondary | ICD-10-CM | POA: Insufficient documentation

## 2020-03-07 DIAGNOSIS — R972 Elevated prostate specific antigen [PSA]: Secondary | ICD-10-CM | POA: Diagnosis not present

## 2020-03-13 DIAGNOSIS — N401 Enlarged prostate with lower urinary tract symptoms: Secondary | ICD-10-CM | POA: Diagnosis not present

## 2020-03-13 DIAGNOSIS — R972 Elevated prostate specific antigen [PSA]: Secondary | ICD-10-CM | POA: Diagnosis not present

## 2020-03-13 DIAGNOSIS — R351 Nocturia: Secondary | ICD-10-CM | POA: Diagnosis not present

## 2020-05-08 DIAGNOSIS — R972 Elevated prostate specific antigen [PSA]: Secondary | ICD-10-CM | POA: Diagnosis not present

## 2020-05-08 DIAGNOSIS — C61 Malignant neoplasm of prostate: Secondary | ICD-10-CM | POA: Diagnosis not present

## 2020-05-08 DIAGNOSIS — D075 Carcinoma in situ of prostate: Secondary | ICD-10-CM | POA: Diagnosis not present

## 2020-05-14 DIAGNOSIS — E1169 Type 2 diabetes mellitus with other specified complication: Secondary | ICD-10-CM | POA: Diagnosis not present

## 2020-05-14 DIAGNOSIS — E785 Hyperlipidemia, unspecified: Secondary | ICD-10-CM | POA: Diagnosis not present

## 2020-05-14 DIAGNOSIS — Z794 Long term (current) use of insulin: Secondary | ICD-10-CM | POA: Diagnosis not present

## 2020-05-14 DIAGNOSIS — E1142 Type 2 diabetes mellitus with diabetic polyneuropathy: Secondary | ICD-10-CM | POA: Diagnosis not present

## 2020-05-14 DIAGNOSIS — E1165 Type 2 diabetes mellitus with hyperglycemia: Secondary | ICD-10-CM | POA: Diagnosis not present

## 2020-05-21 DIAGNOSIS — G479 Sleep disorder, unspecified: Secondary | ICD-10-CM | POA: Diagnosis not present

## 2020-05-21 DIAGNOSIS — I10 Essential (primary) hypertension: Secondary | ICD-10-CM | POA: Diagnosis not present

## 2020-05-21 DIAGNOSIS — E7800 Pure hypercholesterolemia, unspecified: Secondary | ICD-10-CM | POA: Diagnosis not present

## 2020-05-21 DIAGNOSIS — E1165 Type 2 diabetes mellitus with hyperglycemia: Secondary | ICD-10-CM | POA: Diagnosis not present

## 2020-05-21 DIAGNOSIS — E1142 Type 2 diabetes mellitus with diabetic polyneuropathy: Secondary | ICD-10-CM | POA: Diagnosis not present

## 2020-05-21 DIAGNOSIS — Z8719 Personal history of other diseases of the digestive system: Secondary | ICD-10-CM | POA: Diagnosis not present

## 2020-05-21 DIAGNOSIS — N400 Enlarged prostate without lower urinary tract symptoms: Secondary | ICD-10-CM | POA: Diagnosis not present

## 2020-05-21 DIAGNOSIS — Z794 Long term (current) use of insulin: Secondary | ICD-10-CM | POA: Diagnosis not present

## 2020-05-21 DIAGNOSIS — I77811 Abdominal aortic ectasia: Secondary | ICD-10-CM | POA: Diagnosis not present

## 2020-05-22 DIAGNOSIS — C61 Malignant neoplasm of prostate: Secondary | ICD-10-CM | POA: Diagnosis not present

## 2020-05-29 ENCOUNTER — Other Ambulatory Visit (HOSPITAL_COMMUNITY): Payer: Self-pay | Admitting: Urology

## 2020-05-29 DIAGNOSIS — C61 Malignant neoplasm of prostate: Secondary | ICD-10-CM

## 2020-06-04 ENCOUNTER — Encounter: Payer: Self-pay | Admitting: Radiation Oncology

## 2020-06-04 NOTE — Progress Notes (Signed)
See progress note under physician encounter. 

## 2020-06-04 NOTE — Progress Notes (Signed)
GU Location of Tumor / Histology: prostatic adenocarcinoma  If Prostate Cancer, Gleason Score is (4 + 3) and PSA is (4.46). Prostate volume: 46.75 g.  Yohance Hathorne Cleaver PSA was 1.8 in 2018. PSA in February 2019 was 2.8. Patient's father was diagnosed with prostate cancer in his late 37s which was treated with EBRT.    Biopsies of prostate (if applicable) revealed:   Past/Anticipated interventions by urology, if any: prostate biopsy, CT abd/pelvis, bone scan (scheduled for 06/12/20), referral to dr. Tresa Moore to discuss RALP, referral to Dr. Tammi Klippel to discuss IMRT/brachytherapy.  Past/Anticipated interventions by medical oncology, if any: no  Weight changes, if any: no  Bowel/Bladder complaints, if any: no  Nausea/Vomiting, if any: no  Pain issues, if any:  no  SAFETY ISSUES:  Prior radiation? no  Pacemaker/ICD? no  Possible current pregnancy? no, male patient  Is the patient on methotrexate? no  Current Complaints / other details:  69 year old male. Father with hx of prostate ca. Single. Photographer. Stopped smoking in 2004. Spoke with patient via phone no complaints of pain. Questions and concerns addressed.

## 2020-06-05 ENCOUNTER — Encounter: Payer: Self-pay | Admitting: Medical Oncology

## 2020-06-05 ENCOUNTER — Ambulatory Visit
Admission: RE | Admit: 2020-06-05 | Discharge: 2020-06-05 | Disposition: A | Discharge status: Home / Self Care | Payer: PPO | Source: Ambulatory Visit | Attending: Radiation Oncology | Admitting: Radiation Oncology

## 2020-06-05 ENCOUNTER — Encounter: Payer: Self-pay | Admitting: Radiation Oncology

## 2020-06-05 DIAGNOSIS — Z8042 Family history of malignant neoplasm of prostate: Secondary | ICD-10-CM | POA: Diagnosis not present

## 2020-06-05 DIAGNOSIS — C61 Malignant neoplasm of prostate: Secondary | ICD-10-CM

## 2020-06-05 DIAGNOSIS — R972 Elevated prostate specific antigen [PSA]: Secondary | ICD-10-CM | POA: Diagnosis not present

## 2020-06-05 HISTORY — DX: Malignant neoplasm of prostate: C61

## 2020-06-05 NOTE — Progress Notes (Signed)
Left message to introduce myself as the prostate nurse navigator and discuss my role. He saw Dr. Tammi Klippel in consult today to discuss radiation treatment options. He is scheduled to see Dr. Tresa Moore to discuss robotic prostatectomy 7/12 but would like an earlier appointment. I was able to get appointment moved to 7/6 @ 4 pm. I asked him to call me back to confirm this appointment.

## 2020-06-05 NOTE — Patient Instructions (Signed)
Coronavirus (COVID-19) Are you at risk?  Are you at risk for the Coronavirus (COVID-19)?  To be considered HIGH RISK for Coronavirus (COVID-19), you have to meet the following criteria:  . Traveled to [place], [place], [place], [place] or [place]; or in the [place] to [place], [place], [place], or [place]; and have fever, cough, and shortness of breath within the last 2 weeks of travel OR . Been in close contact with a person diagnosed with COVID-19 within the last 2 weeks and have fever, cough, and shortness of breath . IF YOU DO NOT MEET THESE CRITERIA, YOU ARE CONSIDERED LOW RISK FOR COVID-19.  What to do if you are HIGH RISK for COVID-19?  . If you are having a medical emergency, call 911. . Seek medical care right away. Before you go to a doctor's office, urgent care or emergency department, call ahead and tell them about your recent travel, contact with someone diagnosed with COVID-19, and your symptoms. You should receive instructions from your physician's office regarding next steps of care.  . When you arrive at healthcare provider, tell the healthcare staff immediately you have returned from visiting [place], [place], [place], [place] or [place]; or traveled in the [place] to [place], [place], [place], or [place]; in the last two weeks or you have been in close contact with a person diagnosed with COVID-19 in the last 2 weeks.   . Tell the health care staff about your symptoms: fever, cough and shortness of breath. . After you have been seen by a medical provider, you will be either: o Tested for (COVID-19) and discharged home on quarantine except to seek medical care if symptoms worsen, and asked to  - Stay home and avoid contact with others until you get your results (4-5 days)  - Avoid travel on public transportation if possible (such as bus, train, or airplane) or o Sent to the Emergency Department by EMS for evaluation, COVID-19 testing, and possible  admission depending on your condition and test results.  What to do if you are LOW RISK for COVID-19?  Reduce your risk of any infection by using the same precautions used for avoiding the common cold or flu:  . Wash your hands often with soap and warm water for at least 20 seconds.  If soap and water are not readily available, use an alcohol-based hand sanitizer with at least 60% alcohol.  . If coughing or sneezing, cover your mouth and nose by coughing or sneezing into the elbow areas of your shirt or coat, into a tissue or into your sleeve (not your hands). . Avoid shaking hands with others and consider head nods or verbal greetings only. . Avoid touching your eyes, nose, or mouth with unwashed hands.  . Avoid close contact with people who are sick. . Avoid places or events with large numbers of people in one location, like concerts or sporting events. . Carefully consider travel plans you have or are making. . If you are planning any travel outside or inside the [place], visit the CDC's Travelers' Health webpage for the latest health notices. . If you have some symptoms but not all symptoms, continue to monitor at home and seek medical attention if your symptoms worsen. . If you are having a medical emergency, call 911.   ADDITIONAL HEALTHCARE OPTIONS FOR PATIENTS   Telehealth / e-Visit: [email or web address]         [facility]: [phone]  [place]   [facility]: [phone]                   [facility]: [phone]   

## 2020-06-05 NOTE — Progress Notes (Signed)
Radiation Oncology         [phone] ________________________________  Initial Outpatient Consultation - Conducted via Telephone due to current COVID-19 concerns for limiting patient exposure  Name: Andrew Roberson MRN: [id number]  Date: 06/05/2020  DOB: 1951-02-16  JH:ERDEY, Damaris Hippo, PA-C  McKenzie, Candee Furbish, MD   REFERRING PHYSICIAN: Cleon Gustin, MD  DIAGNOSIS: 69 y.o. gentleman with Stage T1c adenocarcinoma of the prostate with Gleason score of 4+3, and PSA of 4.46.    ICD-10-CM   1. Prostate cancer ([place])  C61     HISTORY OF PRESENT ILLNESS: Andrew Roberson is a 69 y.o. male with a diagnosis of prostate cancer. He was noted to have a rising PSA by his primary care physician, Windell Hummingbird, PA-C. Accordingly, he was initially referred for evaluation in urology by Dr. Alyson Ingles on 07/20/18,  digital rectal examination was performed at that time revealing no nodules.  PSA was 1.8 in 2018, 2.8 in 01/2018 and remained stable until 03/07/20 when it was noted to have increased to 4.04. He was referred back to urology at that time and a repeat PSA performed on 03/13/20 remained persistently elevated at 4.46, DRE remained without nodularity or concerning findings. The patient proceeded to transrectal ultrasound with 12 biopsies of the prostate on 05/08/20.  The prostate volume measured 46.75 cc.  Out of 12 core biopsies, 8 were positive, including all right-sided cores.  The maximum Gleason score was 4+3, and this was seen in the right base, right base lateral, right mid lateral, and right apex lateral, all of which also showed PNI. Additionally, Gleason 3+4 was seen in the right apex (with PNI), right mid, left mid, and left mid lateral (small focus).  He is scheduled for staging scans, including CT A/P and bone scan, on 06/12/2020. He is also scheduled for a consult visit with Dr. Tresa Moore on July 03, 2020 to discuss surgical options.  The patient reviewed the biopsy results with his urologist and he has  kindly been referred today for discussion of potential radiation treatment options. Of note, his father was diagnosed with prostate cancer in his late 58's and was treated with EBRT, having a good experience and good results so this is the direction he is leaning currently.   PREVIOUS RADIATION THERAPY: No  PAST MEDICAL HISTORY:  Past Medical History:  Diagnosis Date  . Anxiety   . Diabetes mellitus without complication ([place])   . Prostate cancer ([place])       PAST SURGICAL HISTORY: Past Surgical History:  Procedure Laterality Date  . KNEE RECONSTRUCTION Left 1994  . PROSTATE BIOPSY      FAMILY HISTORY:  Family History  Problem Relation Age of Onset  . Stroke Mother   . Dementia Mother   . Myelodysplastic syndrome Father   . Hypertension Father   . Prostate cancer Father   . Graves' disease Sister   . Liver disease Sister   . Breast cancer Neg Hx   . Colon cancer Neg Hx   . Pancreatic cancer Neg Hx     SOCIAL HISTORY:  Social History   Socioeconomic History  . Marital status: Single    Spouse name: Not on file  . Number of children: 0  . Years of education: Not on file  . Highest education level: Not on file  Occupational History  . Occupation: retired  Tobacco Use  . Smoking status: Former Smoker    Packs/day: 1.00    Years: 25.00  Pack years: 25.00    Quit date: 07/14/2007    Years since quitting: 12.9  . Smokeless tobacco: Never Used  Substance and Sexual Activity  . Alcohol use: Yes    Comment: social rare  . Drug use: Yes    Types: Marijuana    Comment: social  . Sexual activity: Not Currently  Other Topics Concern  . 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   Social Determinants of Health   Financial Resource Strain:   . Difficulty of Paying Living Expenses:   Food Insecurity:   . Worried About Charity fundraiser in the Last Year:   . Arts development officer in the Last Year:   Transportation Needs:   . Film/video editor (Medical):   Marland Kitchen Lack of Transportation (Non-Medical):   Physical Activity:   . Days of Exercise per Week:   . Minutes of Exercise per Session:   Stress:   . Feeling of Stress :   Social Connections:   . Frequency of Communication with Friends and Family:   . Frequency of Social Gatherings with Friends and Family:   . Attends Religious Services:   . Active Member of Clubs or Organizations:   . Attends Archivist Meetings:   Marland Kitchen Marital Status:   Intimate Partner Violence:   . Fear of Current or Ex-Partner:   . Emotionally Abused:   Marland Kitchen Physically Abused:   . Sexually Abused:     ALLERGIES: Codeine  MEDICATIONS:  Current Outpatient Medications  Medication Sig Dispense Refill  . ALPRAZolam (XANAX) 0.25 MG tablet TAKE 1 TABLET BY MOUTH AT BEDTIME AS NEEDED FOR SLEEP    . aspirin 81 MG chewable tablet Chew by mouth.    . Biotin 5 MG CAPS Take by mouth.    . canagliflozin (INVOKANA) 100 MG TABS tablet TAKE ONE TABLET (100 MG DOSE) BY MOUTH DAILY.    . clotrimazole (LOTRIMIN) 1 % cream Apply topically.    Marland Kitchen glucose blood (FREESTYLE LITE) test strip USE TEST STRIPS TO TEST EVERY DAY    . Insulin Glargine (LANTUS) 100 UNIT/ML Solostar Pen INJECT 20 UNITS SUBCUTANEOUSLY EVERY DAY 15 mL 3  . Insulin Pen Needle (PEN NEEDLES) 31G X 5 MM MISC 2 each by Does not apply route daily. 100 each 6  . liraglutide (VICTOZA) 18 MG/3ML SOPN Inject 0.3 mLs (1.8 mg total) into the skin daily. 9 mL 3  . lisinopril (PRINIVIL,ZESTRIL) 10 MG tablet Take 1 tablet (10 mg total) by mouth daily. 90 tablet 3  . metFORMIN (GLUCOPHAGE-XR) 500 MG 24 hr tablet Take 4 tablets (2,000 mg total) by mouth daily with breakfast. 360 tablet 0  . Multiple Vitamin (THERA) TABS Take by mouth.    Marland Kitchen omeprazole (PRILOSEC) 40 MG capsule Take 1 capsule (40 mg total) by mouth 2 (two) times daily. 180 capsule 3  . rosuvastatin (CRESTOR) 5 MG tablet  Take 1 tablet (5 mg total) by mouth daily. 90 tablet 3  . tamsulosin (FLOMAX) 0.4 MG CAPS capsule Take 1 capsule (0.4 mg total) by mouth daily. 90 capsule 4   No current facility-administered medications for this encounter.    REVIEW OF SYSTEMS:  On review of systems, the patient reports that he is doing well overall. He denies any chest pain, shortness of breath, cough, fevers, chills, night sweats, unintended  weight changes. He denies any bowel disturbances, and denies abdominal pain, nausea or vomiting. He denies any new musculoskeletal or joint aches or pains. He endorses taking Flomax daily and current IPSS is only 3, indicating minimal urinary symptoms. A complete review of systems is obtained and is otherwise negative.    PHYSICAL EXAM:  Wt Readings from Last 3 Encounters:  11/17/18 208 lb 12.8 oz (94.7 kg)  10/21/18 206 lb 12.8 oz (93.8 kg)  08/17/18 213 lb 12.8 oz (97 kg)   Temp Readings from Last 3 Encounters:  11/17/18 98 F (36.7 C) (Oral)  10/21/18 98.5 F (36.9 C) (Oral)  08/17/18 98.6 F (37 C) (Oral)   BP Readings from Last 3 Encounters:  11/17/18 124/73  10/21/18 119/74  08/17/18 130/62   Pulse Readings from Last 3 Encounters:  11/17/18 71  10/21/18 76  08/17/18 94   Pain Assessment Pain Score: 0-No pain/10  Physical exam not performed in light of telephone consult visit format.   KPS = 100  100 - Normal; no complaints; no evidence of disease. 90   - Able to carry on normal activity; minor signs or symptoms of disease. 80   - Normal activity with effort; some signs or symptoms of disease. 77   - Cares for self; unable to carry on normal activity or to do active work. 60   - Requires occasional assistance, but is able to care for most of his personal needs. 50   - Requires considerable assistance and frequent medical care. 65   - Disabled; requires special care and assistance. 31   - Severely disabled; hospital admission is indicated although death not  imminent. 58   - Very sick; hospital admission necessary; active supportive treatment necessary. 10   - Moribund; fatal processes progressing rapidly. 0     - Dead  Karnofsky DA, Abelmann [place], Craver LS and Burchenal [facility] [phone]) The use of the nitrogen mustards in the palliative treatment of carcinoma: with particular reference to bronchogenic carcinoma Cancer 1 634-56  LABORATORY DATA:  Lab Results  Component Value Date   WBC 11.4 (H) 11/17/2018   HGB 14.4 11/17/2018   HCT 43.5 11/17/2018   MCV 87 11/17/2018   PLT 321 11/17/2018   Lab Results  Component Value Date   NA 136 11/17/2018   K 4.5 11/17/2018   CL 97 11/17/2018   CO2 21 11/17/2018   Lab Results  Component Value Date   ALT 11 11/17/2018   AST 10 11/17/2018   ALKPHOS 79 11/17/2018   BILITOT 0.4 11/17/2018     RADIOGRAPHY: No results found.    IMPRESSION/PLAN: This visit was conducted via Telephone to spare the patient unnecessary potential exposure in the healthcare setting during the current COVID-19 pandemic. 1. 69 y.o. gentleman with Stage T1c adenocarcinoma of the prostate with Gleason Score of 4+3, and PSA of 4.46. We discussed the patient's workup and outlined the nature of prostate cancer in this setting. The patient's T stage, Gleason's score, and PSA put him into the unfavorable intermediate risk group.  Pending his upcoming staging studies are without any unexpected findings of distant metastatic disease, he is eligible for a variety of potential treatment options including prostatectomy or ST-ADT in combination with either brachytherapy or 5.5 weeks of external radiation, or 5 weeks of external radiation preceded by a brachytherapy boost. We discussed the available radiation techniques, and focused on the details and logistics of delivery. We discussed and outlined the risks, benefits, short and long-term  effects associated with radiotherapy and compared and contrasted these with prostatectomy. We discussed the  role of SpaceOAR in reducing the rectal toxicity associated with radiotherapy. We also detailed the role of ADT in the treatment of unfavorable intermediate risk prostate cancer and outlined the associated side effects that could be expected with this therapy.  He was encouraged to ask questions that were answered to his stated satisfaction.  We addressed questions that he had specific to combination radiation with seed boost and EBRT but advised that this while this is certainly an option, this is not felt necessary in his case given our success here at [facility] with treating Gleason 4+3 disease with seeds alone or EBRT in combination with ST-ADT.  At the end of the conversation, the patient will proceed with disease staging imaging as scheduled on 06/12/2020 and a scheduled consult visit with Dr. Tresa Moore on July 03, 2020 with plans to reach a final treatment decision shortly thereafter.  We look forward to following his progress and are more than happy to continue to participate in his care should he elect to proceed with a form of radiotherapy.  We will share our discussion with Dr. Alyson Ingles and Dr. Tresa Moore.  Currently, he appears to be leaning towards proceeding with short-term ADT concurrent with a 5-1/2-week course of daily prostate IMRT but is also giving consideration to surgery and is interested in learning more at his upcoming consult visit with Dr. Tresa Moore.  We enjoyed meeting him today and provided our contact information should he have any additional questions or concerns.   Given current concerns for patient exposure during the COVID-19 pandemic, this encounter was conducted via telephone. The patient was notified in advance and was offered a MyChart meeting to allow for face to face communication but unfortunately reported that he did not have the appropriate resources/technology to support such a visit and instead preferred to proceed with telephone consult. The patient has given verbal consent for this  type of encounter. The time spent during this encounter was 60 minutes. The attendants for this meeting include Tyler Pita MD, Ashlyn Bruning PA-C, [place], and patient, Andrew Roberson During the encounter, Tyler Pita MD, Ashlyn Bruning PA-C, and scribe, Wilburn Mylar were located at [facility] Radiation Oncology Department.  Patient, Andrew Roberson was located at home.    Nicholos Johns, PA-C    Tyler Pita, MD  [facility] Direct Dial: [phone]  Fax: [phone] [email or web address]  Skype  LinkedIn  This document serves as a record of services personally performed by Tyler Pita, MD and Freeman Caldron, PA-C. It was created on their behalf by Wilburn Mylar, a trained medical scribe. The creation of this record is based on the scribe's personal observations and the provider's statements to them. This document has been checked and approved by the attending provider.

## 2020-06-07 ENCOUNTER — Telehealth: Payer: Self-pay | Admitting: Medical Oncology

## 2020-06-07 NOTE — Telephone Encounter (Signed)
Patient called asking how the CT and bones can will affect him. I discussed how they are done and that he should be able to continue his activities as normal. He voiced understanding.

## 2020-06-11 ENCOUNTER — Encounter: Payer: Self-pay | Admitting: Medical Oncology

## 2020-06-12 ENCOUNTER — Encounter (HOSPITAL_COMMUNITY)
Admission: RE | Admit: 2020-06-12 | Discharge: 2020-06-12 | Disposition: A | Discharge status: Home / Self Care | Payer: PPO | Source: Ambulatory Visit | Attending: Urology | Admitting: Urology

## 2020-06-12 ENCOUNTER — Other Ambulatory Visit: Payer: Self-pay

## 2020-06-12 DIAGNOSIS — C61 Malignant neoplasm of prostate: Secondary | ICD-10-CM

## 2020-06-12 DIAGNOSIS — Z8546 Personal history of malignant neoplasm of prostate: Secondary | ICD-10-CM | POA: Diagnosis not present

## 2020-06-12 MED ORDER — TECHNETIUM TC 99M MEDRONATE IV KIT
21.4000 | PACK | Freq: Once | INTRAVENOUS | Status: AC
  Administered 2020-06-12: 21.4 via INTRAVENOUS

## 2020-07-03 DIAGNOSIS — C61 Malignant neoplasm of prostate: Secondary | ICD-10-CM | POA: Diagnosis not present

## 2020-07-05 ENCOUNTER — Encounter: Payer: Self-pay | Admitting: Medical Oncology

## 2020-07-05 NOTE — Progress Notes (Signed)
Opened in error

## 2020-07-05 NOTE — Progress Notes (Signed)
Patient called stating he has a bone scan and CT 6/15. 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 I explained the injections and scans and that they should not hinder him from doing his work. He was very appreciative of my time.

## 2020-07-05 NOTE — Progress Notes (Signed)
Patient called stating he met with Dr. Tresa Moore, 7/6 to discuss robotic prostatectomy. He is not interested in surgery. He was given a prescription for Bicalutamide and returns 7/20, for Eligard injection. He has not started the oral medication but will start today. I discussed the next steps in the planning process with radiation starting in approx 8 weeks. He had questions about radiation affecting his immune system and these were answered. He asked if he could continue his care with Dr. Alyson Ingles and is aware he will need to travel to [place]. He will need to let [facility] know.  I asked him to call me with questions or concerns. He voiced understanding. Ashlyn, PA notified of the above.

## 2020-07-12 ENCOUNTER — Encounter: Payer: Self-pay | Admitting: Medical Oncology

## 2020-07-17 DIAGNOSIS — Z5111 Encounter for antineoplastic chemotherapy: Secondary | ICD-10-CM | POA: Diagnosis not present

## 2020-07-17 DIAGNOSIS — C61 Malignant neoplasm of prostate: Secondary | ICD-10-CM | POA: Diagnosis not present

## 2020-07-20 DIAGNOSIS — L570 Actinic keratosis: Secondary | ICD-10-CM | POA: Diagnosis not present

## 2020-07-20 DIAGNOSIS — L720 Epidermal cyst: Secondary | ICD-10-CM | POA: Diagnosis not present

## 2020-07-20 DIAGNOSIS — D1801 Hemangioma of skin and subcutaneous tissue: Secondary | ICD-10-CM | POA: Diagnosis not present

## 2020-07-20 DIAGNOSIS — Z85828 Personal history of other malignant neoplasm of skin: Secondary | ICD-10-CM | POA: Diagnosis not present

## 2020-07-20 DIAGNOSIS — L821 Other seborrheic keratosis: Secondary | ICD-10-CM | POA: Diagnosis not present

## 2020-07-20 DIAGNOSIS — L814 Other melanin hyperpigmentation: Secondary | ICD-10-CM | POA: Diagnosis not present

## 2020-07-31 ENCOUNTER — Encounter: Payer: Self-pay | Admitting: Medical Oncology

## 2020-08-02 NOTE — Progress Notes (Signed)
Patient called stating his has been taking Casodex for a month. He does not have any refills and is asking if he needs to  continue. His pharmacy has not received a refill from [facility] and he left a message for Dr. Tresa Moore without a return call. I discussed the purpose of Casodex and he does not need a refill.   I informed he will will start radiation in approx 8 weeks after starting Casodex. He will be scheduled for gold markers/SpaceOar with Dr. Tresa Moore in the near future. I asked him to call me with questions or concerns. He voiced understanding and appreciation for the information.

## 2020-08-14 ENCOUNTER — Other Ambulatory Visit: Payer: Self-pay | Admitting: Urology

## 2020-08-14 DIAGNOSIS — C61 Malignant neoplasm of prostate: Secondary | ICD-10-CM

## 2020-08-15 ENCOUNTER — Telehealth: Payer: Self-pay | Admitting: *Deleted

## 2020-08-15 ENCOUNTER — Other Ambulatory Visit: Payer: Self-pay | Admitting: Urology

## 2020-08-15 MED ORDER — ALPRAZOLAM 0.5 MG PO TABS: 0.5000 mg | ORAL_TABLET | ORAL | 0 refills | Status: DC | PRN

## 2020-08-15 NOTE — Telephone Encounter (Signed)
CALLED PATIENT TO INFORM THAT HIS SCRIPT IS READY FOR PICK-UP @ [facility] SPOKE WITH PATIENT AND HE IS AWARE OF THIS

## 2020-08-15 NOTE — Telephone Encounter (Signed)
Called patient to inform of sim and MRI for 08-24-20, spoke with patient and he is aware of these appts.

## 2020-08-16 ENCOUNTER — Encounter: Payer: Self-pay | Admitting: Urology

## 2020-08-16 ENCOUNTER — Encounter: Payer: Self-pay | Admitting: Medical Oncology

## 2020-08-16 DIAGNOSIS — E1142 Type 2 diabetes mellitus with diabetic polyneuropathy: Secondary | ICD-10-CM | POA: Diagnosis not present

## 2020-08-16 DIAGNOSIS — E1169 Type 2 diabetes mellitus with other specified complication: Secondary | ICD-10-CM | POA: Diagnosis not present

## 2020-08-16 DIAGNOSIS — E1159 Type 2 diabetes mellitus with other circulatory complications: Secondary | ICD-10-CM | POA: Diagnosis not present

## 2020-08-16 DIAGNOSIS — E1165 Type 2 diabetes mellitus with hyperglycemia: Secondary | ICD-10-CM | POA: Diagnosis not present

## 2020-08-16 DIAGNOSIS — I152 Hypertension secondary to endocrine disorders: Secondary | ICD-10-CM | POA: Diagnosis not present

## 2020-08-16 DIAGNOSIS — I10 Essential (primary) hypertension: Secondary | ICD-10-CM | POA: Diagnosis not present

## 2020-08-16 DIAGNOSIS — E785 Hyperlipidemia, unspecified: Secondary | ICD-10-CM | POA: Diagnosis not present

## 2020-08-16 DIAGNOSIS — Z794 Long term (current) use of insulin: Secondary | ICD-10-CM | POA: Diagnosis not present

## 2020-08-16 NOTE — Progress Notes (Signed)
Scheduled for fiducial markers and SpaceOAR with Dr. Alyson Ingles 08/21/20 and CT [facility] 08/24/20.  Nicholos Johns, MMS, PA-C [place] at [place]: [phone]  Fax: [phone]

## 2020-08-16 NOTE — Progress Notes (Signed)
Patient left message yesterday when I was out of office asking about MRI. He thought it was diagnostic but explained it is ot  check placement of SpaceOar gel. He is claustrophobic but Ashlyn, PA prescribed Ativan.  I reminded him to have a driver. He had questions about radiation schedule once treatment begins. All questions were answered. CT simulation scheduled 8/27 and he confirmed this appointment. He was very appreciative of the call and I asked him to call me back with questions or concerns.

## 2020-08-21 DIAGNOSIS — C61 Malignant neoplasm of prostate: Secondary | ICD-10-CM | POA: Diagnosis not present

## 2020-08-22 ENCOUNTER — Telehealth: Payer: Self-pay | Admitting: *Deleted

## 2020-08-22 NOTE — Telephone Encounter (Signed)
CALLED PATIENT TO REMIND OF SIM AND MRI FOR 08-24-20, SPOKE WITH PATIENT AND HE IS AWARE OF THESE APPTS.

## 2020-08-24 ENCOUNTER — Ambulatory Visit (HOSPITAL_COMMUNITY)
Admission: RE | Admit: 2020-08-24 | Discharge: 2020-08-24 | Disposition: A | Discharge status: Home / Self Care | Payer: PPO | Source: Ambulatory Visit | Attending: Urology | Admitting: Urology

## 2020-08-24 ENCOUNTER — Other Ambulatory Visit: Payer: Self-pay

## 2020-08-24 ENCOUNTER — Ambulatory Visit
Admission: RE | Admit: 2020-08-24 | Discharge: 2020-08-24 | Disposition: A | Discharge status: Home / Self Care | Payer: PPO | Source: Ambulatory Visit | Attending: Radiation Oncology | Admitting: Radiation Oncology

## 2020-08-24 ENCOUNTER — Encounter: Payer: Self-pay | Admitting: Medical Oncology

## 2020-08-24 DIAGNOSIS — C61 Malignant neoplasm of prostate: Secondary | ICD-10-CM | POA: Insufficient documentation

## 2020-08-24 DIAGNOSIS — Z510 Encounter for antineoplastic radiation therapy: Secondary | ICD-10-CM | POA: Insufficient documentation

## 2020-09-04 DIAGNOSIS — Z510 Encounter for antineoplastic radiation therapy: Secondary | ICD-10-CM | POA: Insufficient documentation

## 2020-09-04 DIAGNOSIS — R300 Dysuria: Secondary | ICD-10-CM | POA: Insufficient documentation

## 2020-09-04 DIAGNOSIS — C61 Malignant neoplasm of prostate: Secondary | ICD-10-CM | POA: Diagnosis not present

## 2020-09-04 NOTE — Progress Notes (Signed)
  Radiation Oncology         [phone] ________________________________  Name: Andrew Roberson MRN: [id number]  Date: 08/24/2020  DOB: 1951-04-05  SIMULATION AND TREATMENT PLANNING NOTE    ICD-10-CM   1. Prostate cancer ([place])  C61     DIAGNOSIS:  69 y.o. gentleman with Stage T1c adenocarcinoma of the prostate with Gleason score of 4+3, and PSA of 4.46.  NARRATIVE:  The patient was brought to the [facility].  Identity was confirmed.  All relevant records and images related to the planned course of therapy were reviewed.  The patient freely provided informed written consent to proceed with treatment after reviewing the details related to the planned course of therapy. The consent form was witnessed and verified by the simulation staff.  Then, the patient was set-up in a stable reproducible supine position for radiation therapy.  A vacuum lock pillow device was custom fabricated to position his legs in a reproducible immobilized position.  Then, I performed a urethrogram under sterile conditions to identify the prostatic apex.  CT images were obtained.  Surface markings were placed.  The CT images were loaded into the planning software.  Then the prostate target and avoidance structures including the rectum, bladder, bowel and hips were contoured.  Treatment planning then occurred.  The radiation prescription was entered and confirmed.  A total of one complex treatment devices was fabricated. I have requested : Intensity Modulated Radiotherapy (IMRT) is medically necessary for this case for the following reason:  Rectal sparing.Marland Kitchen  PLAN:  The patient will receive 70 Gy in 28 fractions.  ________________________________  Sheral Apley Tammi Klippel, M.D.

## 2020-09-05 ENCOUNTER — Ambulatory Visit
Admission: RE | Admit: 2020-09-05 | Discharge: 2020-09-05 | Disposition: A | Discharge status: Home / Self Care | Payer: PPO | Source: Ambulatory Visit | Attending: Radiation Oncology | Admitting: Radiation Oncology

## 2020-09-05 ENCOUNTER — Other Ambulatory Visit: Payer: Self-pay

## 2020-09-05 DIAGNOSIS — R300 Dysuria: Secondary | ICD-10-CM | POA: Diagnosis not present

## 2020-09-05 DIAGNOSIS — C61 Malignant neoplasm of prostate: Secondary | ICD-10-CM | POA: Diagnosis not present

## 2020-09-06 ENCOUNTER — Ambulatory Visit
Admission: RE | Admit: 2020-09-06 | Discharge: 2020-09-06 | Disposition: A | Discharge status: Home / Self Care | Payer: PPO | Source: Ambulatory Visit | Attending: Radiation Oncology | Admitting: Radiation Oncology

## 2020-09-06 ENCOUNTER — Encounter: Payer: Self-pay | Admitting: Medical Oncology

## 2020-09-06 DIAGNOSIS — C61 Malignant neoplasm of prostate: Secondary | ICD-10-CM | POA: Diagnosis not present

## 2020-09-06 DIAGNOSIS — R300 Dysuria: Secondary | ICD-10-CM | POA: Diagnosis not present

## 2020-09-07 ENCOUNTER — Ambulatory Visit
Admission: RE | Admit: 2020-09-07 | Discharge: 2020-09-07 | Disposition: A | Discharge status: Home / Self Care | Payer: PPO | Source: Ambulatory Visit | Attending: Radiation Oncology | Admitting: Radiation Oncology

## 2020-09-07 DIAGNOSIS — C61 Malignant neoplasm of prostate: Secondary | ICD-10-CM | POA: Diagnosis not present

## 2020-09-07 DIAGNOSIS — R300 Dysuria: Secondary | ICD-10-CM | POA: Diagnosis not present

## 2020-09-10 ENCOUNTER — Ambulatory Visit
Admission: RE | Admit: 2020-09-10 | Discharge: 2020-09-10 | Disposition: A | Discharge status: Home / Self Care | Payer: PPO | Source: Ambulatory Visit | Attending: Radiation Oncology | Admitting: Radiation Oncology

## 2020-09-10 ENCOUNTER — Other Ambulatory Visit: Payer: Self-pay

## 2020-09-10 DIAGNOSIS — R300 Dysuria: Secondary | ICD-10-CM | POA: Diagnosis not present

## 2020-09-10 DIAGNOSIS — C61 Malignant neoplasm of prostate: Secondary | ICD-10-CM | POA: Diagnosis not present

## 2020-09-11 ENCOUNTER — Ambulatory Visit
Admission: RE | Admit: 2020-09-11 | Discharge: 2020-09-11 | Disposition: A | Discharge status: Home / Self Care | Payer: PPO | Source: Ambulatory Visit | Attending: Radiation Oncology | Admitting: Radiation Oncology

## 2020-09-11 ENCOUNTER — Other Ambulatory Visit: Payer: Self-pay

## 2020-09-11 DIAGNOSIS — C61 Malignant neoplasm of prostate: Secondary | ICD-10-CM | POA: Diagnosis not present

## 2020-09-11 DIAGNOSIS — R300 Dysuria: Secondary | ICD-10-CM | POA: Diagnosis not present

## 2020-09-12 ENCOUNTER — Other Ambulatory Visit: Payer: Self-pay

## 2020-09-12 ENCOUNTER — Ambulatory Visit
Admission: RE | Admit: 2020-09-12 | Discharge: 2020-09-12 | Disposition: A | Discharge status: Home / Self Care | Payer: PPO | Source: Ambulatory Visit | Attending: Radiation Oncology | Admitting: Radiation Oncology

## 2020-09-12 DIAGNOSIS — C61 Malignant neoplasm of prostate: Secondary | ICD-10-CM | POA: Diagnosis not present

## 2020-09-12 DIAGNOSIS — R300 Dysuria: Secondary | ICD-10-CM | POA: Diagnosis not present

## 2020-09-13 ENCOUNTER — Ambulatory Visit
Admission: RE | Admit: 2020-09-13 | Discharge: 2020-09-13 | Disposition: A | Discharge status: Home / Self Care | Payer: PPO | Source: Ambulatory Visit | Attending: Radiation Oncology | Admitting: Radiation Oncology

## 2020-09-13 ENCOUNTER — Other Ambulatory Visit: Payer: Self-pay

## 2020-09-13 DIAGNOSIS — C61 Malignant neoplasm of prostate: Secondary | ICD-10-CM | POA: Diagnosis not present

## 2020-09-13 DIAGNOSIS — R300 Dysuria: Secondary | ICD-10-CM | POA: Diagnosis not present

## 2020-09-14 ENCOUNTER — Other Ambulatory Visit: Payer: Self-pay

## 2020-09-14 ENCOUNTER — Other Ambulatory Visit: Payer: Self-pay | Admitting: Radiation Oncology

## 2020-09-14 ENCOUNTER — Ambulatory Visit
Admission: RE | Admit: 2020-09-14 | Discharge: 2020-09-14 | Disposition: A | Discharge status: Home / Self Care | Payer: PPO | Source: Ambulatory Visit | Attending: Radiation Oncology | Admitting: Radiation Oncology

## 2020-09-14 DIAGNOSIS — R300 Dysuria: Secondary | ICD-10-CM | POA: Diagnosis not present

## 2020-09-14 DIAGNOSIS — C61 Malignant neoplasm of prostate: Secondary | ICD-10-CM | POA: Diagnosis not present

## 2020-09-17 ENCOUNTER — Ambulatory Visit
Admission: RE | Admit: 2020-09-17 | Discharge: 2020-09-17 | Disposition: A | Discharge status: Home / Self Care | Payer: PPO | Source: Ambulatory Visit | Attending: Radiation Oncology | Admitting: Radiation Oncology

## 2020-09-17 ENCOUNTER — Other Ambulatory Visit: Payer: Self-pay

## 2020-09-17 DIAGNOSIS — C61 Malignant neoplasm of prostate: Secondary | ICD-10-CM | POA: Diagnosis not present

## 2020-09-17 DIAGNOSIS — R300 Dysuria: Secondary | ICD-10-CM | POA: Diagnosis not present

## 2020-09-18 ENCOUNTER — Ambulatory Visit
Admission: RE | Admit: 2020-09-18 | Discharge: 2020-09-18 | Disposition: A | Discharge status: Home / Self Care | Payer: PPO | Source: Ambulatory Visit | Attending: Radiation Oncology | Admitting: Radiation Oncology

## 2020-09-18 DIAGNOSIS — R300 Dysuria: Secondary | ICD-10-CM | POA: Diagnosis not present

## 2020-09-18 DIAGNOSIS — C61 Malignant neoplasm of prostate: Secondary | ICD-10-CM | POA: Diagnosis not present

## 2020-09-19 ENCOUNTER — Telehealth: Payer: Self-pay | Admitting: Radiation Oncology

## 2020-09-19 ENCOUNTER — Other Ambulatory Visit: Payer: Self-pay

## 2020-09-19 ENCOUNTER — Ambulatory Visit
Admission: RE | Admit: 2020-09-19 | Discharge: 2020-09-19 | Disposition: A | Discharge status: Home / Self Care | Payer: PPO | Source: Ambulatory Visit | Attending: Radiation Oncology | Admitting: Radiation Oncology

## 2020-09-19 DIAGNOSIS — R300 Dysuria: Secondary | ICD-10-CM | POA: Diagnosis not present

## 2020-09-19 DIAGNOSIS — C61 Malignant neoplasm of prostate: Secondary | ICD-10-CM | POA: Diagnosis not present

## 2020-09-19 LAB — URINALYSIS, COMPLETE (UACMP) WITH MICROSCOPIC
Bacteria, UA: NONE SEEN
Bilirubin Urine: NEGATIVE
Glucose, UA: >=500 mg/dL — AB
Hgb urine dipstick: NEGATIVE
Ketones, ur: NEGATIVE mg/dL
Leukocytes,Ua: NEGATIVE
Nitrite: NEGATIVE
Protein, ur: 30 mg/dL — AB
Specific Gravity, Urine: 1.02 (ref 1.005–1.030)
pH: 6 (ref 5.0–8.0)

## 2020-09-19 NOTE — Telephone Encounter (Signed)
Thanks, Sam.  That sounds very reasonable to check urinalysis to r/o UTI.  I will watch for those results and agree with starting AZO for symptom management after sample is provided. Pending those results do not indicate infection, we can try increasing the Flomax to BID to see if this helps with the nocturia, along with the addition of AZO. He can also use Imodium prn diarrhea/bowel urgency. Since we are now 2 weeks in to his planned 5.5 week course of prostate IMRT, it will be difficult to tell how much of the LUTS are related to the Invokana vs radiation cystitis and his PCP may want him to give it a little more time on the medication before making a switch but we will leave that up to them.  Thank you for your help! -Dereona Kolodny

## 2020-09-19 NOTE — Telephone Encounter (Signed)
Received voicemail message from patient requesting a return call. Phoned patient back to inquire.   Patient reports he was up all night "urinating every 10-23 minutes" and "it really burns." Patient endorses taking Flomax one tablet each morning. In addition, patient reports recently resuming INVOKANA since the price was reduced to an amount he could afford. Finally, therapist told this RN they have to place a chucks under him (per his request) for treatment because he has bowel urgency.   Instructed patient to increase water intake and avoid bladder irritants. Instructed patient to provide a urine sample following his radiation treatment today to rule out infection. Explained collection cup and a note with a picture of AZO will be waiting for him at the treatment machine. Instructed patient to provide sample and then begin taking AZO while we await the return of his lab work. Patient questions if he should stop his invokana since "that is making me pee more." Instructed patient to follow up with his PCP reference stopping invokana. Patient verbalized understanding of all reviewed and appreciation for the call back.    69 y.o. gentleman with Stage T1c adenocarcinoma of the prostate with Gleason score of 4+3, and PSA of 4.46.  Received 10/28 IMRT prostate treatments.

## 2020-09-20 ENCOUNTER — Telehealth: Payer: Self-pay | Admitting: Radiation Oncology

## 2020-09-20 ENCOUNTER — Ambulatory Visit
Admission: RE | Admit: 2020-09-20 | Discharge: 2020-09-20 | Disposition: A | Discharge status: Home / Self Care | Payer: PPO | Source: Ambulatory Visit | Attending: Radiation Oncology | Admitting: Radiation Oncology

## 2020-09-20 DIAGNOSIS — C61 Malignant neoplasm of prostate: Secondary | ICD-10-CM | POA: Diagnosis not present

## 2020-09-20 DIAGNOSIS — R300 Dysuria: Secondary | ICD-10-CM | POA: Diagnosis not present

## 2020-09-20 LAB — URINE CULTURE: Culture: NO GROWTH

## 2020-09-20 NOTE — Telephone Encounter (Signed)
Phoned patient as directed by Freeman Caldron, PA-C. Explained there are no signs of infection on urinalysis but there is a lot of sugar indicating poor control of diabetes. Strongly encouraged patient to follow up with PCP as soon as possible reference elevated blood glucose and possible medication adjustment.  Patient reports eating an excessive amount of carbohydrates before providing urine sample yesterday. Patient verbalized understanding that the recommendation is still to follow up with PCP reference elevated glucose. Also, patient reports the AZO recommended yesterday helped tremendously with dysuria. Patient denies additional needs at this time and verbalized appreciation for the return call.

## 2020-09-21 ENCOUNTER — Ambulatory Visit
Admission: RE | Admit: 2020-09-21 | Discharge: 2020-09-21 | Disposition: A | Discharge status: Home / Self Care | Payer: PPO | Source: Ambulatory Visit | Attending: Radiation Oncology | Admitting: Radiation Oncology

## 2020-09-21 ENCOUNTER — Other Ambulatory Visit: Payer: Self-pay

## 2020-09-21 DIAGNOSIS — C61 Malignant neoplasm of prostate: Secondary | ICD-10-CM | POA: Diagnosis not present

## 2020-09-21 DIAGNOSIS — R300 Dysuria: Secondary | ICD-10-CM | POA: Diagnosis not present

## 2020-09-24 ENCOUNTER — Other Ambulatory Visit: Payer: Self-pay

## 2020-09-24 ENCOUNTER — Ambulatory Visit
Admission: RE | Admit: 2020-09-24 | Discharge: 2020-09-24 | Disposition: A | Discharge status: Home / Self Care | Payer: PPO | Source: Ambulatory Visit | Attending: Radiation Oncology | Admitting: Radiation Oncology

## 2020-09-24 DIAGNOSIS — C61 Malignant neoplasm of prostate: Secondary | ICD-10-CM | POA: Diagnosis not present

## 2020-09-24 DIAGNOSIS — R300 Dysuria: Secondary | ICD-10-CM | POA: Diagnosis not present

## 2020-09-25 ENCOUNTER — Ambulatory Visit
Admission: RE | Admit: 2020-09-25 | Discharge: 2020-09-25 | Disposition: A | Discharge status: Home / Self Care | Payer: PPO | Source: Ambulatory Visit | Attending: Radiation Oncology | Admitting: Radiation Oncology

## 2020-09-25 DIAGNOSIS — R300 Dysuria: Secondary | ICD-10-CM | POA: Diagnosis not present

## 2020-09-25 DIAGNOSIS — C61 Malignant neoplasm of prostate: Secondary | ICD-10-CM | POA: Diagnosis not present

## 2020-09-26 ENCOUNTER — Ambulatory Visit
Admission: RE | Admit: 2020-09-26 | Discharge: 2020-09-26 | Disposition: A | Discharge status: Home / Self Care | Payer: PPO | Source: Ambulatory Visit | Attending: Radiation Oncology | Admitting: Radiation Oncology

## 2020-09-26 DIAGNOSIS — C61 Malignant neoplasm of prostate: Secondary | ICD-10-CM | POA: Diagnosis not present

## 2020-09-26 DIAGNOSIS — R300 Dysuria: Secondary | ICD-10-CM | POA: Diagnosis not present

## 2020-09-27 ENCOUNTER — Ambulatory Visit
Admission: RE | Admit: 2020-09-27 | Discharge: 2020-09-27 | Disposition: A | Discharge status: Home / Self Care | Payer: PPO | Source: Ambulatory Visit | Attending: Radiation Oncology | Admitting: Radiation Oncology

## 2020-09-27 DIAGNOSIS — R300 Dysuria: Secondary | ICD-10-CM | POA: Diagnosis not present

## 2020-09-27 DIAGNOSIS — C61 Malignant neoplasm of prostate: Secondary | ICD-10-CM | POA: Diagnosis not present

## 2020-09-28 ENCOUNTER — Ambulatory Visit
Admission: RE | Admit: 2020-09-28 | Discharge: 2020-09-28 | Disposition: A | Discharge status: Home / Self Care | Payer: PPO | Source: Ambulatory Visit | Attending: Radiation Oncology | Admitting: Radiation Oncology

## 2020-09-28 DIAGNOSIS — Z510 Encounter for antineoplastic radiation therapy: Secondary | ICD-10-CM | POA: Insufficient documentation

## 2020-09-28 DIAGNOSIS — C61 Malignant neoplasm of prostate: Secondary | ICD-10-CM | POA: Diagnosis not present

## 2020-09-28 DIAGNOSIS — R300 Dysuria: Secondary | ICD-10-CM | POA: Diagnosis not present

## 2020-10-01 ENCOUNTER — Ambulatory Visit
Admission: RE | Admit: 2020-10-01 | Discharge: 2020-10-01 | Disposition: A | Discharge status: Home / Self Care | Payer: PPO | Source: Ambulatory Visit | Attending: Radiation Oncology | Admitting: Radiation Oncology

## 2020-10-01 DIAGNOSIS — C61 Malignant neoplasm of prostate: Secondary | ICD-10-CM | POA: Diagnosis not present

## 2020-10-01 DIAGNOSIS — Z510 Encounter for antineoplastic radiation therapy: Secondary | ICD-10-CM | POA: Diagnosis not present

## 2020-10-02 ENCOUNTER — Ambulatory Visit
Admission: RE | Admit: 2020-10-02 | Discharge: 2020-10-02 | Disposition: A | Discharge status: Home / Self Care | Payer: PPO | Source: Ambulatory Visit | Attending: Radiation Oncology | Admitting: Radiation Oncology

## 2020-10-02 DIAGNOSIS — C61 Malignant neoplasm of prostate: Secondary | ICD-10-CM | POA: Diagnosis not present

## 2020-10-02 DIAGNOSIS — Z510 Encounter for antineoplastic radiation therapy: Secondary | ICD-10-CM | POA: Diagnosis not present

## 2020-10-03 ENCOUNTER — Ambulatory Visit
Admission: RE | Admit: 2020-10-03 | Discharge: 2020-10-03 | Disposition: A | Discharge status: Home / Self Care | Payer: PPO | Source: Ambulatory Visit | Attending: Radiation Oncology | Admitting: Radiation Oncology

## 2020-10-03 DIAGNOSIS — Z510 Encounter for antineoplastic radiation therapy: Secondary | ICD-10-CM | POA: Diagnosis not present

## 2020-10-03 DIAGNOSIS — C61 Malignant neoplasm of prostate: Secondary | ICD-10-CM | POA: Diagnosis not present

## 2020-10-04 ENCOUNTER — Ambulatory Visit
Admission: RE | Admit: 2020-10-04 | Discharge: 2020-10-04 | Disposition: A | Discharge status: Home / Self Care | Payer: PPO | Source: Ambulatory Visit | Attending: Radiation Oncology | Admitting: Radiation Oncology

## 2020-10-04 DIAGNOSIS — C61 Malignant neoplasm of prostate: Secondary | ICD-10-CM | POA: Diagnosis not present

## 2020-10-04 DIAGNOSIS — Z510 Encounter for antineoplastic radiation therapy: Secondary | ICD-10-CM | POA: Diagnosis not present

## 2020-10-05 ENCOUNTER — Ambulatory Visit
Admission: RE | Admit: 2020-10-05 | Discharge: 2020-10-05 | Disposition: A | Discharge status: Home / Self Care | Payer: PPO | Source: Ambulatory Visit | Attending: Radiation Oncology | Admitting: Radiation Oncology

## 2020-10-05 DIAGNOSIS — Z510 Encounter for antineoplastic radiation therapy: Secondary | ICD-10-CM | POA: Diagnosis not present

## 2020-10-05 DIAGNOSIS — C61 Malignant neoplasm of prostate: Secondary | ICD-10-CM | POA: Diagnosis not present

## 2020-10-08 ENCOUNTER — Ambulatory Visit
Admission: RE | Admit: 2020-10-08 | Discharge: 2020-10-08 | Disposition: A | Discharge status: Home / Self Care | Payer: PPO | Source: Ambulatory Visit | Attending: Radiation Oncology | Admitting: Radiation Oncology

## 2020-10-08 DIAGNOSIS — C61 Malignant neoplasm of prostate: Secondary | ICD-10-CM | POA: Diagnosis not present

## 2020-10-08 DIAGNOSIS — Z510 Encounter for antineoplastic radiation therapy: Secondary | ICD-10-CM | POA: Diagnosis not present

## 2020-10-09 ENCOUNTER — Ambulatory Visit
Admission: RE | Admit: 2020-10-09 | Discharge: 2020-10-09 | Disposition: A | Discharge status: Home / Self Care | Payer: PPO | Source: Ambulatory Visit | Attending: Radiation Oncology | Admitting: Radiation Oncology

## 2020-10-09 DIAGNOSIS — Z510 Encounter for antineoplastic radiation therapy: Secondary | ICD-10-CM | POA: Diagnosis not present

## 2020-10-09 DIAGNOSIS — C61 Malignant neoplasm of prostate: Secondary | ICD-10-CM | POA: Diagnosis not present

## 2020-10-10 ENCOUNTER — Ambulatory Visit
Admission: RE | Admit: 2020-10-10 | Discharge: 2020-10-10 | Disposition: A | Discharge status: Home / Self Care | Payer: PPO | Source: Ambulatory Visit | Attending: Radiation Oncology | Admitting: Radiation Oncology

## 2020-10-10 DIAGNOSIS — C61 Malignant neoplasm of prostate: Secondary | ICD-10-CM | POA: Diagnosis not present

## 2020-10-10 DIAGNOSIS — Z510 Encounter for antineoplastic radiation therapy: Secondary | ICD-10-CM | POA: Diagnosis not present

## 2020-10-11 ENCOUNTER — Ambulatory Visit
Admission: RE | Admit: 2020-10-11 | Discharge: 2020-10-11 | Disposition: A | Discharge status: Home / Self Care | Payer: PPO | Source: Ambulatory Visit | Attending: Radiation Oncology | Admitting: Radiation Oncology

## 2020-10-11 DIAGNOSIS — Z510 Encounter for antineoplastic radiation therapy: Secondary | ICD-10-CM | POA: Diagnosis not present

## 2020-10-11 DIAGNOSIS — C61 Malignant neoplasm of prostate: Secondary | ICD-10-CM | POA: Diagnosis not present

## 2020-10-12 ENCOUNTER — Encounter: Payer: Self-pay | Admitting: Urology

## 2020-10-12 ENCOUNTER — Other Ambulatory Visit: Payer: Self-pay | Admitting: Radiation Oncology

## 2020-10-12 ENCOUNTER — Ambulatory Visit
Admission: RE | Admit: 2020-10-12 | Discharge: 2020-10-12 | Disposition: A | Discharge status: Home / Self Care | Payer: PPO | Source: Ambulatory Visit | Attending: Radiation Oncology | Admitting: Radiation Oncology

## 2020-10-12 ENCOUNTER — Encounter: Payer: Self-pay | Admitting: Medical Oncology

## 2020-10-12 DIAGNOSIS — Z510 Encounter for antineoplastic radiation therapy: Secondary | ICD-10-CM | POA: Diagnosis not present

## 2020-10-12 DIAGNOSIS — C61 Malignant neoplasm of prostate: Secondary | ICD-10-CM | POA: Diagnosis not present

## 2020-10-12 MED ORDER — MEGESTROL ACETATE 20 MG PO TABS: 20.0000 mg | ORAL_TABLET | Freq: Every day | ORAL | 3 refills | Status: DC

## 2020-11-13 ENCOUNTER — Encounter: Payer: Self-pay | Admitting: Medical Oncology

## 2020-11-13 NOTE — Progress Notes (Signed)
Patient called to confirm his follow up appointment is by telephone. I informed him Ashlyn,PA will call him @10  am. He voiced understanding.

## 2020-11-14 ENCOUNTER — Ambulatory Visit: Payer: Self-pay | Admitting: Urology

## 2020-11-14 ENCOUNTER — Encounter: Payer: Self-pay | Admitting: Urology

## 2020-11-14 ENCOUNTER — Other Ambulatory Visit: Payer: Self-pay

## 2020-11-14 NOTE — Progress Notes (Addendum)
Patient states that he empties his bladder most of the time. Patient denies any dysuria or hematuria. Patient states that he has a  moderate stream. Patient denies any issues with his bowels. Patient reports nocturia 2-4. Patient states that he last seen his urologist in June and that he will see him again in January 2022.Patients  IPPS was a 12. Patients meaningful use is complete and patient is aware that this will be a phone visit.

## 2020-11-15 ENCOUNTER — Ambulatory Visit
Admission: RE | Admit: 2020-11-15 | Discharge: 2020-11-15 | Disposition: A | Discharge status: Home / Self Care | Payer: PPO | Source: Ambulatory Visit | Attending: Urology | Admitting: Urology

## 2020-11-15 DIAGNOSIS — C61 Malignant neoplasm of prostate: Secondary | ICD-10-CM

## 2020-11-15 NOTE — Progress Notes (Signed)
Radiation Oncology         [phone] ________________________________  Name: Andrew Roberson MRN: [id number]  Date: 11/15/2020  DOB: Aug 11, 1951  Post Treatment Note  CC: Gale Journey Damaris Hippo, PA-C  McKenzie, Candee Furbish, MD  Diagnosis:   69 y.o. gentleman with Stage T1c adenocarcinoma of the prostate with Gleason score of 4+3, and PSA of 4.46.  Interval Since Last Radiation:  5 weeks; concurrent with ST-ADT 09/05/20 - 10/12/20:   The prostate was treated to 70 Gy in 28 fractions of 2.5 Gy  Narrative:  I spoke with the patient to conduct his routine scheduled 1 month follow up visit via telephone to spare the patient unnecessary potential exposure in the healthcare setting during the current COVID-19 pandemic.  The patient was notified in advance and gave permission to proceed with this visit format.  He tolerated radiation treatment relatively well with only minor urinary irritation and modest fatigue.  He did experience increased nocturia, dysuria, frequency, weak stream, intermittency and feelings of incomplete bladder emptying.  The LUTS were improved with the use of over-the-counter AZO and Flomax daily.  He denied any gross hematuria or incontinence.  He did develop occasional diarrhea.                              On review of systems, the patient states that he is doing well in general.  He has had gradual improvement in the LUTS, currently with nocturia 2-4 times per night but with improved flow of stream and ability to empty his bladder.  He specifically denies any dysuria, gross hematuria, straining to void or incontinence.  His current IPSS score is 12, indicating mild to moderate urinary symptoms.  He reports resolution of the diarrhea and denies any abdominal pain, nausea or vomiting.  He has a healthy appetite and is maintaining his weight.  He continues with mild fatigue and occasional hot flashes associated with the ADT.  Overall, he is quite pleased with his progress to  date.  ALLERGIES:  is allergic to codeine.  Meds: Current Outpatient Medications  Medication Sig Dispense Refill  . ALPRAZolam (XANAX) 0.25 MG tablet TAKE 1 TABLET BY MOUTH AT BEDTIME AS NEEDED FOR SLEEP    . aspirin 81 MG chewable tablet Chew by mouth.    . Biotin 5 MG CAPS Take by mouth.    . canagliflozin (INVOKANA) 100 MG TABS tablet TAKE ONE TABLET (100 MG DOSE) BY MOUTH DAILY.    . clotrimazole (LOTRIMIN) 1 % cream Apply topically.    Marland Kitchen glucose blood (FREESTYLE LITE) test strip USE TEST STRIPS TO TEST EVERY DAY    . Insulin Glargine (LANTUS) 100 UNIT/ML Solostar Pen INJECT 20 UNITS SUBCUTANEOUSLY EVERY DAY 15 mL 3  . Insulin Pen Needle (PEN NEEDLES) 31G X 5 MM MISC 2 each by Does not apply route daily. 100 each 6  . liraglutide (VICTOZA) 18 MG/3ML SOPN Inject 0.3 mLs (1.8 mg total) into the skin daily. 9 mL 3  . lisinopril (PRINIVIL,ZESTRIL) 10 MG tablet Take 1 tablet (10 mg total) by mouth daily. 90 tablet 3  . metFORMIN (GLUCOPHAGE-XR) 500 MG 24 hr tablet Take 4 tablets (2,000 mg total) by mouth daily with breakfast. 360 tablet 0  . Multiple Vitamin (THERA) TABS Take by mouth.    Marland Kitchen omeprazole (PRILOSEC) 40 MG capsule Take 1 capsule (40 mg total) by mouth 2 (two) times daily. 180 capsule 3  . rosuvastatin (  CRESTOR) 5 MG tablet Take 1 tablet (5 mg total) by mouth daily. 90 tablet 3  . tamsulosin (FLOMAX) 0.4 MG CAPS capsule Take 1 capsule (0.4 mg total) by mouth daily. 90 capsule 4  . ALPRAZolam (XANAX) 0.5 MG tablet Take 1 tablet (0.5 mg total) by mouth as needed for anxiety (take 30 minutes prior to MRI and may repeat once, just prior to scan if needed.). (Patient not taking: Reported on 11/14/2020) 2 tablet 0  . megestrol (MEGACE) 20 MG tablet Take 1 tablet (20 mg total) by mouth daily. (Patient not taking: Reported on 11/14/2020) 30 tablet 3   No current facility-administered medications for this encounter.    Physical Findings:  vitals were not taken for this visit.   /Unable  to assess due to telephone follow-up visit format.  Lab Findings: Lab Results  Component Value Date   WBC 11.4 (H) 11/17/2018   HGB 14.4 11/17/2018   HCT 43.5 11/17/2018   MCV 87 11/17/2018   PLT 321 11/17/2018     Radiographic Findings: No results found.  Impression/Plan: 1. 69 y.o. gentleman with Stage T1c adenocarcinoma of the prostate with Gleason score of 4+3, and PSA of 4.46. He will continue to follow up with urology for ongoing PSA determinations and plans to continue in follow-up with Dr. Louis Meckel in January 2022 but currently does not have a scheduled follow-up visit to his knowledge. He understands what to expect with regards to PSA monitoring going forward. I will look forward to following his response to treatment via correspondence with urology, and would be happy to continue to participate in his care if clinically indicated. I talked to the patient about what to expect in the future, including his risk for erectile dysfunction and rectal bleeding. I encouraged him to call or return to the office if he has any questions regarding his previous radiation or possible radiation side effects. He was comfortable with this plan and will follow up as needed.    Nicholos Johns, PA-C

## 2020-11-15 NOTE — Progress Notes (Signed)
  Radiation Oncology         [phone] ________________________________  Name: Andrew Roberson MRN: [id number]  Date: 10/12/2020  DOB: Apr 16, 1951  End of Treatment Note  Diagnosis:   69 y.o. gentleman with Stage T1c adenocarcinoma of the prostate with Gleason score of 4+3, and PSA of 4.46.     Indication for treatment:  Curative, Definitive Radiotherapy       Radiation treatment dates:   09/05/20 - 10/12/20  Site/dose:   The prostate was treated to 70 Gy in 28 fractions of 2.5 Gy  Beams/energy:   The patient was treated with IMRT using volumetric arc therapy delivering 6 MV X-rays to clockwise and counterclockwise circumferential arcs with a 90 degree collimator offset to avoid dose scalloping.  Image guidance was performed with daily cone beam CT prior to each fraction to align to gold markers in the prostate and assure proper bladder and rectal fill volumes.  Immobilization was achieved with BodyFix custom mold.  Narrative: The patient tolerated radiation treatment relatively well with only minor urinary irritation and modest fatigue.  He did experience increased nocturia, dysuria, frequency, weak stream, intermittency and feelings of incomplete bladder emptying.  The LUTS were improved with the use of over-the-counter AZO and Flomax daily.  He denied any gross hematuria or incontinence.  He did develop occasional diarrhea.  Plan: The patient has completed radiation treatment. He will return to radiation oncology clinic for routine followup in one month. I advised him to call or return sooner if he has any questions or concerns related to his recovery or treatment. ________________________________  Sheral Apley. Tammi Klippel, M.D.

## 2020-11-30 DIAGNOSIS — Z794 Long term (current) use of insulin: Secondary | ICD-10-CM | POA: Diagnosis not present

## 2020-11-30 DIAGNOSIS — Z0000 Encounter for general adult medical examination without abnormal findings: Secondary | ICD-10-CM | POA: Diagnosis not present

## 2020-11-30 DIAGNOSIS — E7800 Pure hypercholesterolemia, unspecified: Secondary | ICD-10-CM | POA: Diagnosis not present

## 2020-11-30 DIAGNOSIS — R1110 Vomiting, unspecified: Secondary | ICD-10-CM | POA: Diagnosis not present

## 2020-11-30 DIAGNOSIS — N400 Enlarged prostate without lower urinary tract symptoms: Secondary | ICD-10-CM | POA: Diagnosis not present

## 2020-11-30 DIAGNOSIS — E1165 Type 2 diabetes mellitus with hyperglycemia: Secondary | ICD-10-CM | POA: Diagnosis not present

## 2020-11-30 DIAGNOSIS — F4321 Adjustment disorder with depressed mood: Secondary | ICD-10-CM | POA: Diagnosis not present

## 2020-11-30 DIAGNOSIS — I10 Essential (primary) hypertension: Secondary | ICD-10-CM | POA: Diagnosis not present

## 2020-11-30 DIAGNOSIS — E1142 Type 2 diabetes mellitus with diabetic polyneuropathy: Secondary | ICD-10-CM | POA: Diagnosis not present

## 2021-01-01 DIAGNOSIS — C61 Malignant neoplasm of prostate: Secondary | ICD-10-CM | POA: Diagnosis not present

## 2021-01-29 DIAGNOSIS — Z8546 Personal history of malignant neoplasm of prostate: Secondary | ICD-10-CM | POA: Diagnosis not present

## 2021-01-29 DIAGNOSIS — R351 Nocturia: Secondary | ICD-10-CM | POA: Diagnosis not present

## 2021-02-08 DIAGNOSIS — H2513 Age-related nuclear cataract, bilateral: Secondary | ICD-10-CM | POA: Diagnosis not present

## 2021-02-08 DIAGNOSIS — H17822 Peripheral opacity of cornea, left eye: Secondary | ICD-10-CM | POA: Diagnosis not present

## 2021-02-08 DIAGNOSIS — H18513 Endothelial corneal dystrophy, bilateral: Secondary | ICD-10-CM | POA: Diagnosis not present

## 2021-02-08 DIAGNOSIS — H04123 Dry eye syndrome of bilateral lacrimal glands: Secondary | ICD-10-CM | POA: Diagnosis not present

## 2021-02-08 DIAGNOSIS — H43813 Vitreous degeneration, bilateral: Secondary | ICD-10-CM | POA: Diagnosis not present

## 2021-02-08 DIAGNOSIS — E119 Type 2 diabetes mellitus without complications: Secondary | ICD-10-CM | POA: Diagnosis not present

## 2021-02-27 DIAGNOSIS — E1165 Type 2 diabetes mellitus with hyperglycemia: Secondary | ICD-10-CM | POA: Diagnosis not present

## 2021-02-27 DIAGNOSIS — E1142 Type 2 diabetes mellitus with diabetic polyneuropathy: Secondary | ICD-10-CM | POA: Diagnosis not present

## 2021-02-27 DIAGNOSIS — Z794 Long term (current) use of insulin: Secondary | ICD-10-CM | POA: Diagnosis not present

## 2021-02-27 DIAGNOSIS — I10 Essential (primary) hypertension: Secondary | ICD-10-CM | POA: Diagnosis not present

## 2021-02-27 DIAGNOSIS — E7800 Pure hypercholesterolemia, unspecified: Secondary | ICD-10-CM | POA: Diagnosis not present

## 2021-03-07 DIAGNOSIS — I152 Hypertension secondary to endocrine disorders: Secondary | ICD-10-CM | POA: Diagnosis not present

## 2021-03-07 DIAGNOSIS — E1142 Type 2 diabetes mellitus with diabetic polyneuropathy: Secondary | ICD-10-CM | POA: Diagnosis not present

## 2021-03-07 DIAGNOSIS — E1159 Type 2 diabetes mellitus with other circulatory complications: Secondary | ICD-10-CM | POA: Diagnosis not present

## 2021-03-07 DIAGNOSIS — Z794 Long term (current) use of insulin: Secondary | ICD-10-CM | POA: Diagnosis not present

## 2021-03-07 DIAGNOSIS — D649 Anemia, unspecified: Secondary | ICD-10-CM | POA: Diagnosis not present

## 2021-03-07 DIAGNOSIS — E1165 Type 2 diabetes mellitus with hyperglycemia: Secondary | ICD-10-CM | POA: Diagnosis not present

## 2021-03-07 DIAGNOSIS — E1169 Type 2 diabetes mellitus with other specified complication: Secondary | ICD-10-CM | POA: Diagnosis not present

## 2021-03-07 DIAGNOSIS — Z87891 Personal history of nicotine dependence: Secondary | ICD-10-CM | POA: Diagnosis not present

## 2021-03-07 DIAGNOSIS — E785 Hyperlipidemia, unspecified: Secondary | ICD-10-CM | POA: Diagnosis not present

## 2021-03-27 DIAGNOSIS — Z794 Long term (current) use of insulin: Secondary | ICD-10-CM | POA: Diagnosis not present

## 2021-03-27 DIAGNOSIS — E1142 Type 2 diabetes mellitus with diabetic polyneuropathy: Secondary | ICD-10-CM | POA: Diagnosis not present

## 2021-03-27 DIAGNOSIS — E1159 Type 2 diabetes mellitus with other circulatory complications: Secondary | ICD-10-CM | POA: Diagnosis not present

## 2021-03-27 DIAGNOSIS — I152 Hypertension secondary to endocrine disorders: Secondary | ICD-10-CM | POA: Diagnosis not present

## 2021-03-27 DIAGNOSIS — Z87891 Personal history of nicotine dependence: Secondary | ICD-10-CM | POA: Diagnosis not present

## 2021-03-27 DIAGNOSIS — D649 Anemia, unspecified: Secondary | ICD-10-CM | POA: Diagnosis not present

## 2021-03-27 DIAGNOSIS — E1165 Type 2 diabetes mellitus with hyperglycemia: Secondary | ICD-10-CM | POA: Diagnosis not present

## 2021-03-27 DIAGNOSIS — E1169 Type 2 diabetes mellitus with other specified complication: Secondary | ICD-10-CM | POA: Diagnosis not present

## 2021-03-27 DIAGNOSIS — E785 Hyperlipidemia, unspecified: Secondary | ICD-10-CM | POA: Diagnosis not present

## 2021-03-29 ENCOUNTER — Other Ambulatory Visit: Payer: Self-pay | Admitting: Physician Assistant

## 2021-03-29 DIAGNOSIS — Z87891 Personal history of nicotine dependence: Secondary | ICD-10-CM

## 2021-04-19 ENCOUNTER — Ambulatory Visit
Admission: RE | Admit: 2021-04-19 | Discharge: 2021-04-19 | Disposition: A | Discharge status: Home / Self Care | Payer: PPO | Source: Ambulatory Visit | Attending: Physician Assistant | Admitting: Physician Assistant

## 2021-04-19 DIAGNOSIS — Z87891 Personal history of nicotine dependence: Secondary | ICD-10-CM

## 2021-04-19 DIAGNOSIS — I712 Thoracic aortic aneurysm, without rupture: Secondary | ICD-10-CM | POA: Diagnosis not present

## 2021-04-19 DIAGNOSIS — J929 Pleural plaque without asbestos: Secondary | ICD-10-CM | POA: Diagnosis not present

## 2021-04-19 DIAGNOSIS — I2510 Atherosclerotic heart disease of native coronary artery without angina pectoris: Secondary | ICD-10-CM | POA: Diagnosis not present

## 2021-05-16 ENCOUNTER — Encounter: Payer: Self-pay | Admitting: Cardiothoracic Surgery

## 2021-05-16 ENCOUNTER — Other Ambulatory Visit: Payer: Self-pay

## 2021-05-16 ENCOUNTER — Institutional Professional Consult (permissible substitution): Payer: PPO | Admitting: Cardiothoracic Surgery

## 2021-05-16 DIAGNOSIS — I712 Thoracic aortic aneurysm, without rupture, unspecified: Secondary | ICD-10-CM | POA: Insufficient documentation

## 2021-06-05 DIAGNOSIS — Z794 Long term (current) use of insulin: Secondary | ICD-10-CM | POA: Diagnosis not present

## 2021-06-05 DIAGNOSIS — E1159 Type 2 diabetes mellitus with other circulatory complications: Secondary | ICD-10-CM | POA: Diagnosis not present

## 2021-06-05 DIAGNOSIS — I152 Hypertension secondary to endocrine disorders: Secondary | ICD-10-CM | POA: Diagnosis not present

## 2021-06-05 DIAGNOSIS — E1165 Type 2 diabetes mellitus with hyperglycemia: Secondary | ICD-10-CM | POA: Diagnosis not present

## 2021-06-05 DIAGNOSIS — E1142 Type 2 diabetes mellitus with diabetic polyneuropathy: Secondary | ICD-10-CM | POA: Diagnosis not present

## 2021-06-05 DIAGNOSIS — D649 Anemia, unspecified: Secondary | ICD-10-CM | POA: Diagnosis not present

## 2021-06-07 DIAGNOSIS — E1169 Type 2 diabetes mellitus with other specified complication: Secondary | ICD-10-CM | POA: Diagnosis not present

## 2021-06-07 DIAGNOSIS — E1159 Type 2 diabetes mellitus with other circulatory complications: Secondary | ICD-10-CM | POA: Diagnosis not present

## 2021-06-07 DIAGNOSIS — I152 Hypertension secondary to endocrine disorders: Secondary | ICD-10-CM | POA: Diagnosis not present

## 2021-06-07 DIAGNOSIS — C61 Malignant neoplasm of prostate: Secondary | ICD-10-CM | POA: Diagnosis not present

## 2021-06-07 DIAGNOSIS — Z794 Long term (current) use of insulin: Secondary | ICD-10-CM | POA: Diagnosis not present

## 2021-06-07 DIAGNOSIS — Z0000 Encounter for general adult medical examination without abnormal findings: Secondary | ICD-10-CM | POA: Diagnosis not present

## 2021-06-07 DIAGNOSIS — E1165 Type 2 diabetes mellitus with hyperglycemia: Secondary | ICD-10-CM | POA: Diagnosis not present

## 2021-06-07 DIAGNOSIS — I77811 Abdominal aortic ectasia: Secondary | ICD-10-CM | POA: Diagnosis not present

## 2021-06-07 DIAGNOSIS — E785 Hyperlipidemia, unspecified: Secondary | ICD-10-CM | POA: Diagnosis not present

## 2021-06-07 DIAGNOSIS — E1142 Type 2 diabetes mellitus with diabetic polyneuropathy: Secondary | ICD-10-CM | POA: Diagnosis not present

## 2021-07-24 DIAGNOSIS — Z85828 Personal history of other malignant neoplasm of skin: Secondary | ICD-10-CM | POA: Diagnosis not present

## 2021-07-24 DIAGNOSIS — D485 Neoplasm of uncertain behavior of skin: Secondary | ICD-10-CM | POA: Diagnosis not present

## 2021-07-24 DIAGNOSIS — D2261 Melanocytic nevi of right upper limb, including shoulder: Secondary | ICD-10-CM | POA: Diagnosis not present

## 2021-07-24 DIAGNOSIS — L814 Other melanin hyperpigmentation: Secondary | ICD-10-CM | POA: Diagnosis not present

## 2021-07-24 DIAGNOSIS — D1801 Hemangioma of skin and subcutaneous tissue: Secondary | ICD-10-CM | POA: Diagnosis not present

## 2021-07-24 DIAGNOSIS — L821 Other seborrheic keratosis: Secondary | ICD-10-CM | POA: Diagnosis not present

## 2021-07-29 DIAGNOSIS — C61 Malignant neoplasm of prostate: Secondary | ICD-10-CM | POA: Diagnosis not present

## 2021-08-01 NOTE — Progress Notes (Signed)
[address]       [place] [address]             [phone]     CARDIOTHORACIC SURGERY CONSULTATION REPORT  Referring Provider is Gale Journey, Damaris Hippo, PA-C Primary Cardiologist is None PCP is Gale Journey, Damaris Hippo, PA-C  Chief Complaint  Patient presents with   Thoracic Aortic Aneurysm    New patient consultation, CTA chest 04/19/21    HPI:  70 year old man with a history of smoking underwent low-dose lung cancer screening CT scan this past spring which demonstrated small aortic dilation.  The maximal diameter is less than 4.5 cm.  He has a history of hypertension but no history of aneurysm personally or in his family.  Past Medical History:  Diagnosis Date   Anxiety    Diabetes mellitus without complication ([place])    Prostate cancer ([place])     Past Surgical History:  Procedure Laterality Date   KNEE RECONSTRUCTION Left 1994   PROSTATE BIOPSY      Family History  Problem Relation Age of Onset   Stroke Mother    Dementia Mother    Myelodysplastic syndrome Father    Hypertension Father    Prostate cancer Father    Graves' disease Sister    Liver disease Sister    Breast cancer Neg Hx    Colon cancer Neg Hx    Pancreatic cancer Neg Hx     Social History   Socioeconomic History   Marital status: Single    Spouse name: Not on file   Number of children: 0   Years of education: Not on file   Highest education level: Not on file  Occupational History   Occupation: retired  Tobacco Use   Smoking status: Former    Packs/day: 1.00    Years: 25.00    Pack years: 25.00    Types: Cigarettes    Quit date: 07/14/2007    Years since quitting: 14.0   Smokeless tobacco: Never  Vaping Use   Vaping Use: Never used  Substance and Sexual Activity   Alcohol use: Yes    Comment: social rare   Drug use: Yes    Types: Marijuana    Comment: social   Sexual activity: Not Currently  Other Topics Concern   Not on file  Social History Narrative   Retired -  ran hotels   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   Social Determinants of Health   Financial Resource Strain: Not on file  Food Insecurity: Not on file  Transportation Needs: Not on file  Physical Activity: Not on file  Stress: Not on file  Social Connections: Not on file  Intimate Partner Violence: Not on file    Current Outpatient Medications  Medication Sig Dispense Refill   ALPRAZolam (XANAX) 0.25 MG tablet TAKE 1 TABLET BY MOUTH AT BEDTIME AS NEEDED FOR SLEEP     aspirin 81 MG chewable tablet Chew by mouth.     Biotin 5 MG CAPS Take by mouth.     canagliflozin (INVOKANA) 100 MG TABS tablet TAKE ONE TABLET (100 MG DOSE) BY MOUTH DAILY.     clotrimazole (LOTRIMIN) 1 % cream Apply topically.     glucose blood (FREESTYLE LITE) test strip USE TEST STRIPS TO TEST EVERY DAY     Insulin Glargine (LANTUS) 100 UNIT/ML Solostar Pen INJECT 20 UNITS SUBCUTANEOUSLY EVERY DAY 15 mL  3   Insulin Pen Needle (PEN NEEDLES) 31G X 5 MM MISC 2 each by Does not apply route daily. 100 each 6   liraglutide (VICTOZA) 18 MG/3ML SOPN Inject 0.3 mLs (1.8 mg total) into the skin daily. 9 mL 3   lisinopril (PRINIVIL,ZESTRIL) 10 MG tablet Take 1 tablet (10 mg total) by mouth daily. 90 tablet 3   metFORMIN (GLUCOPHAGE-XR) 500 MG 24 hr tablet Take 4 tablets (2,000 mg total) by mouth daily with breakfast. 360 tablet 0   Multiple Vitamin (THERA) TABS Take by mouth.     omeprazole (PRILOSEC) 40 MG capsule Take 1 capsule (40 mg total) by mouth 2 (two) times daily. 180 capsule 3   rosuvastatin (CRESTOR) 5 MG tablet Take 1 tablet (5 mg total) by mouth daily. 90 tablet 3   tamsulosin (FLOMAX) 0.4 MG CAPS capsule Take 1 capsule (0.4 mg total) by mouth daily. 90 capsule 4   No current facility-administered medications for this visit.    Allergies  Allergen Reactions   Codeine     Other reaction(s): Abdominal Pain      Review of Systems:   General:  Decreased energy  level  Cardiac:  No chest pain or shortness of breath; history of hypertension  Respiratory:  Denies cough  GI:   History of constipation and diarrhea; history of colonoscopy 10 years ago  GU:   Questionable BPH  Vascular:  No claudication or venous disease  Neuro:   No strokes or TIAs  musculoskeletal: Denies arthritis  Skin:   Negative  Psych:   History of anxiety  Eyes:   History of floaters  ENT:   Last dental visit this month  Hematologic:  Negative  Endocrine:  Is diabetic     Physical Exam:   BP 124/74 (BP Location: Left Arm, Patient Position: Sitting, Cuff Size: Normal)   Pulse 98   Resp 20   Ht '5\' 10"'$  (1.778 m)   Wt 97.5 kg   SpO2 94% Comment: RA  BMI 30.85 kg/m   General:    well-appearing  HEENT:  Unremarkable   Neck:   no JVD, no bruits, no adenopathy   Chest:   clear to auscultation, symmetrical breath sounds, no wheezes, no rhonchi   CV:   RRR, no detectable murmur   Abdomen:  soft, non-tender, no masses   Extremities:  warm, well-perfused, pulses intact, no LE edema  Rectal/GU  Deferred  Neuro:   Grossly non-focal and symmetrical throughout  Skin:   Clean and dry, no rashes, no breakdown   Diagnostic Tests:  I personally reviewed his available CT scan from 04/19/21 showing a small aortic aneurysm in the ascending component with maximal diameter of less than 4.5 cm   Impression:  Small ascending aortic aneurysm posing small risk for urgent aortic condition   Plan:  Follow-up in 1 year with repeat CT scan; this can be coordinated with low-dose lung scanning   I spent in excess of 20 minutes during the conduct of this office consultation and >50% of this time involved direct face-to-face encounter with the patient for counseling and/or coordination of their care.          Level 3 Office Consult = 40 minutes         Level 4 Office Consult = 60 minutes         Level 5 Office Consult = 80 minutes  B.  Murvin Natal, MD 08/01/2021 1:46 PM

## 2021-09-04 DIAGNOSIS — Z794 Long term (current) use of insulin: Secondary | ICD-10-CM | POA: Diagnosis not present

## 2021-09-04 DIAGNOSIS — E1165 Type 2 diabetes mellitus with hyperglycemia: Secondary | ICD-10-CM | POA: Diagnosis not present

## 2021-09-04 DIAGNOSIS — E1142 Type 2 diabetes mellitus with diabetic polyneuropathy: Secondary | ICD-10-CM | POA: Diagnosis not present

## 2021-09-04 DIAGNOSIS — E1159 Type 2 diabetes mellitus with other circulatory complications: Secondary | ICD-10-CM | POA: Diagnosis not present

## 2021-09-04 DIAGNOSIS — I152 Hypertension secondary to endocrine disorders: Secondary | ICD-10-CM | POA: Diagnosis not present

## 2021-09-04 DIAGNOSIS — E1169 Type 2 diabetes mellitus with other specified complication: Secondary | ICD-10-CM | POA: Diagnosis not present

## 2021-09-04 DIAGNOSIS — E785 Hyperlipidemia, unspecified: Secondary | ICD-10-CM | POA: Diagnosis not present

## 2021-09-09 DIAGNOSIS — Z794 Long term (current) use of insulin: Secondary | ICD-10-CM | POA: Diagnosis not present

## 2021-09-09 DIAGNOSIS — E1169 Type 2 diabetes mellitus with other specified complication: Secondary | ICD-10-CM | POA: Diagnosis not present

## 2021-09-09 DIAGNOSIS — C61 Malignant neoplasm of prostate: Secondary | ICD-10-CM | POA: Diagnosis not present

## 2021-09-09 DIAGNOSIS — I152 Hypertension secondary to endocrine disorders: Secondary | ICD-10-CM | POA: Diagnosis not present

## 2021-09-09 DIAGNOSIS — E1159 Type 2 diabetes mellitus with other circulatory complications: Secondary | ICD-10-CM | POA: Diagnosis not present

## 2021-09-09 DIAGNOSIS — Z23 Encounter for immunization: Secondary | ICD-10-CM | POA: Diagnosis not present

## 2021-09-09 DIAGNOSIS — E1165 Type 2 diabetes mellitus with hyperglycemia: Secondary | ICD-10-CM | POA: Diagnosis not present

## 2021-09-09 DIAGNOSIS — E1142 Type 2 diabetes mellitus with diabetic polyneuropathy: Secondary | ICD-10-CM | POA: Diagnosis not present

## 2021-10-01 DIAGNOSIS — Z8546 Personal history of malignant neoplasm of prostate: Secondary | ICD-10-CM | POA: Diagnosis not present

## 2021-10-01 DIAGNOSIS — R351 Nocturia: Secondary | ICD-10-CM | POA: Diagnosis not present

## 2021-10-01 DIAGNOSIS — N5201 Erectile dysfunction due to arterial insufficiency: Secondary | ICD-10-CM | POA: Diagnosis not present

## 2021-11-28 DIAGNOSIS — E1169 Type 2 diabetes mellitus with other specified complication: Secondary | ICD-10-CM | POA: Diagnosis not present

## 2021-11-28 DIAGNOSIS — E1142 Type 2 diabetes mellitus with diabetic polyneuropathy: Secondary | ICD-10-CM | POA: Diagnosis not present

## 2021-11-28 DIAGNOSIS — Z794 Long term (current) use of insulin: Secondary | ICD-10-CM | POA: Diagnosis not present

## 2021-11-28 DIAGNOSIS — E785 Hyperlipidemia, unspecified: Secondary | ICD-10-CM | POA: Diagnosis not present

## 2021-12-03 IMAGING — US US ABDOMINAL AORTA SCREENING AAA
1 series · 14 of 21 positions shown · non-contrast
Comparison: Abdominal aorta screening 02/24/2019

CLINICAL DATA: Medicare screening

EXAM:
US ABDOMINAL AORTA MEDICARE SCREENING
TECHNIQUE: Ultrasound examination of the abdominal aorta was performed as a
screening evaluation for abdominal aortic aneurysm.

[Series 1: us abdominal aorta screening aaa · 0.39mm/px · 14 of 21 slices shown]
[im 1/21]
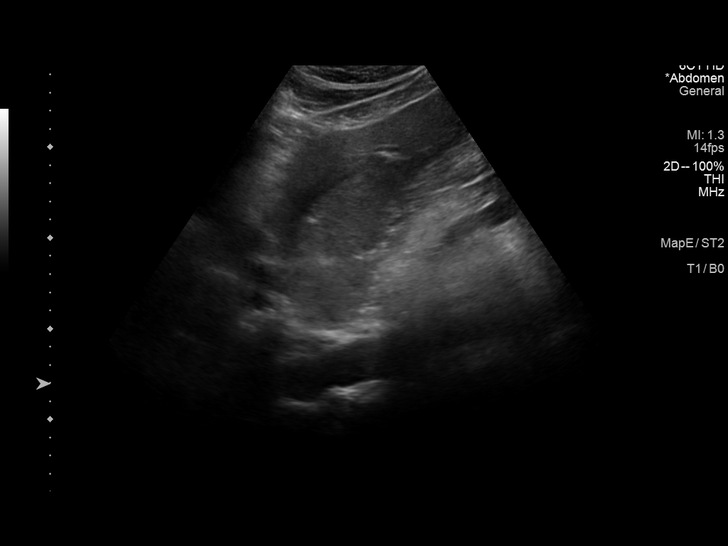
[im 3/21]
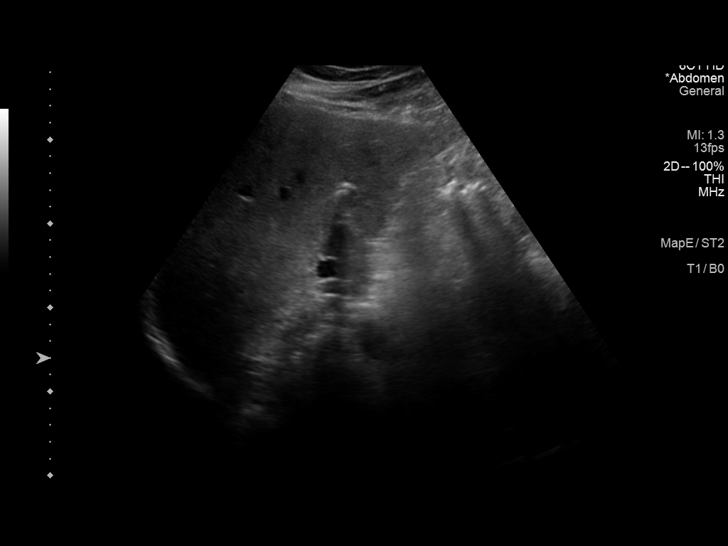
[im 4/21]
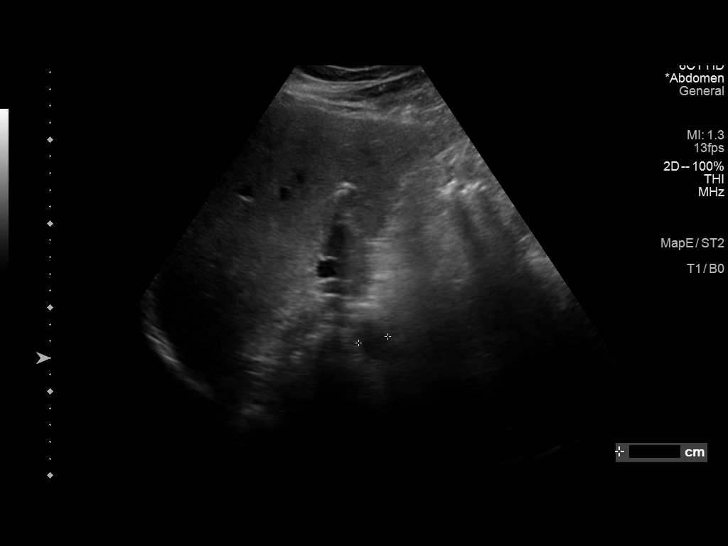
[im 6/21]
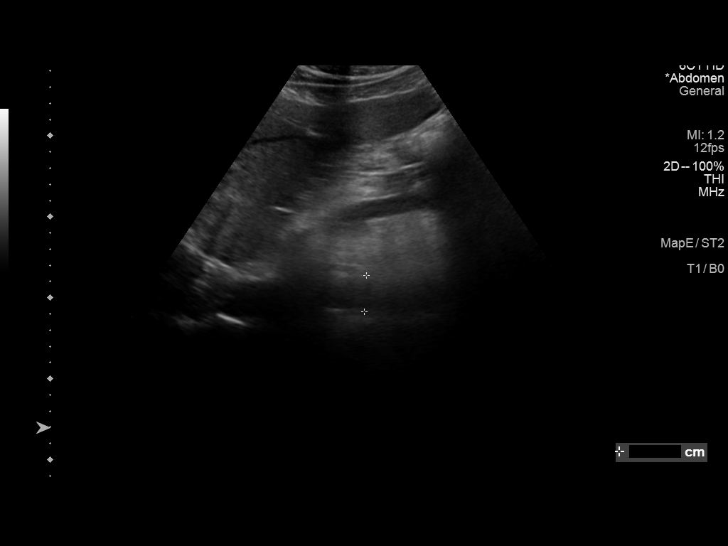
[im 7/21]
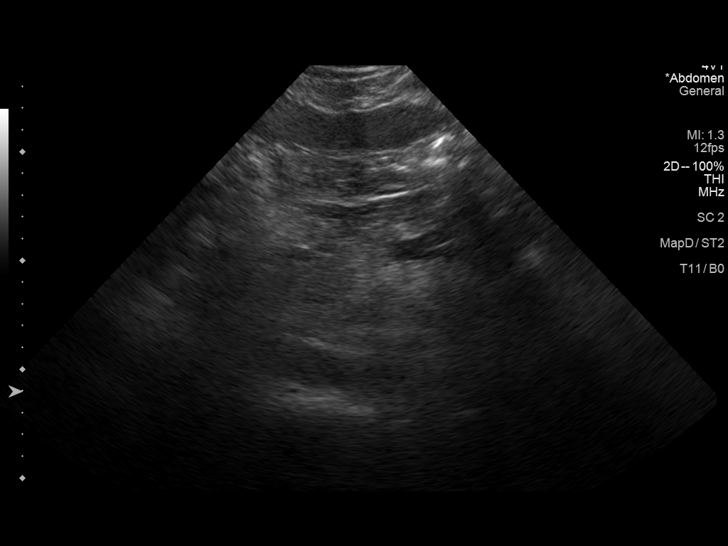
[im 9/21]
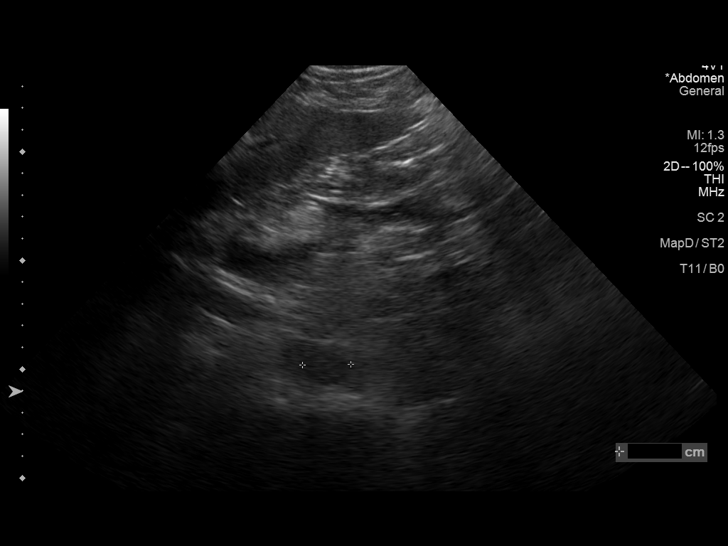
[im 10/21]
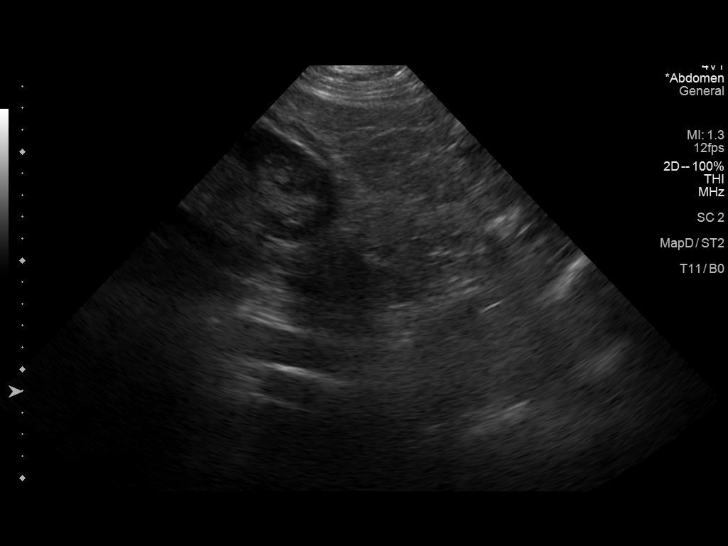
[im 12/21]
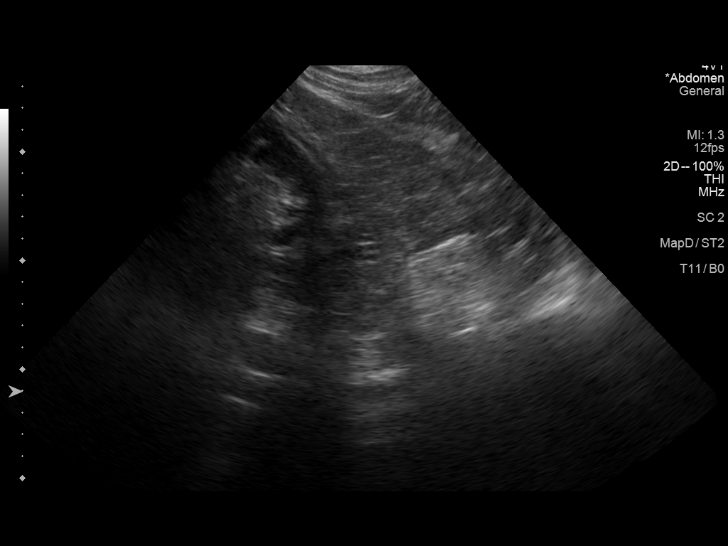
[im 13/21]
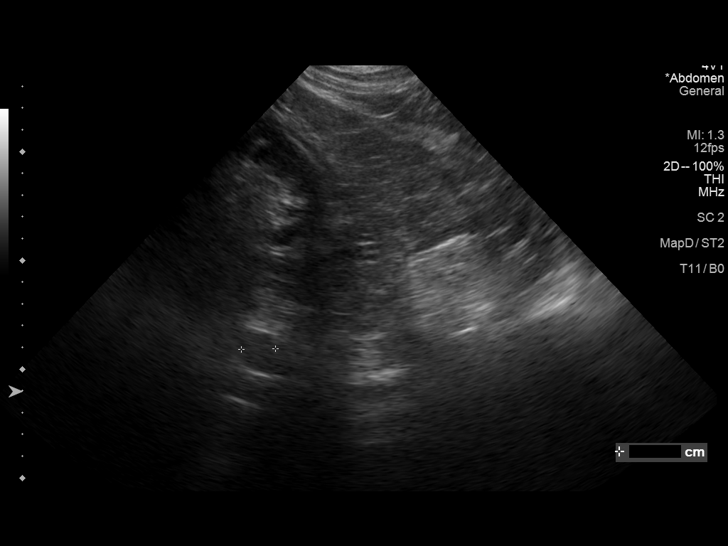
[im 15/21]
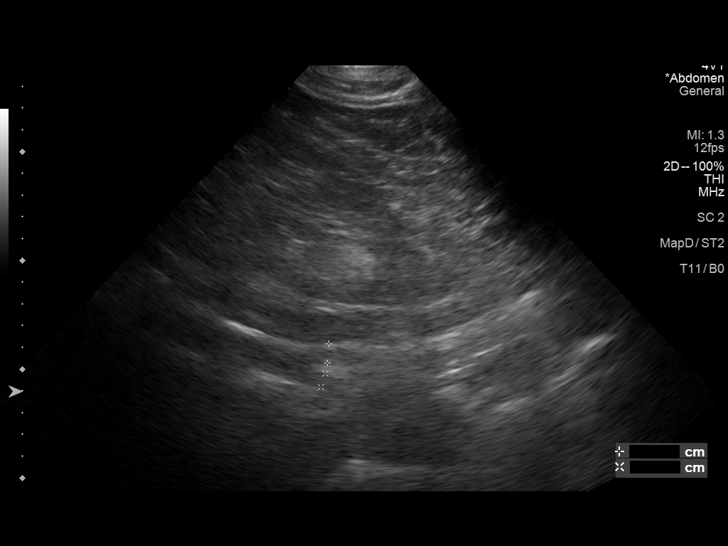
[im 16/21]
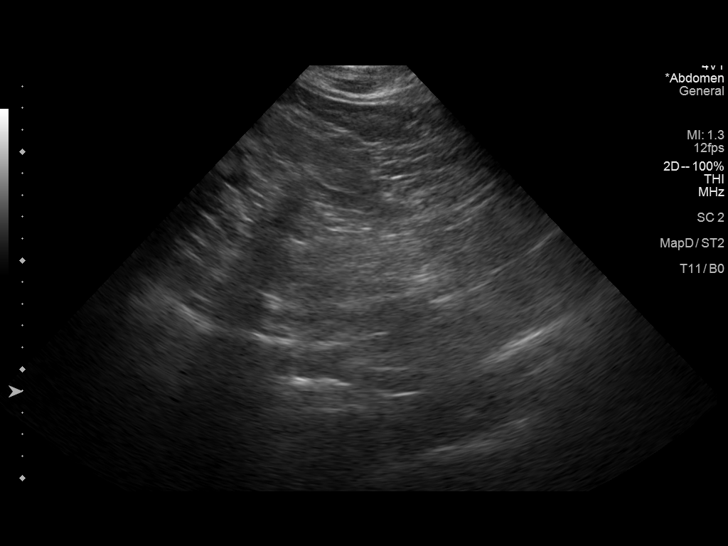
[im 18/21]
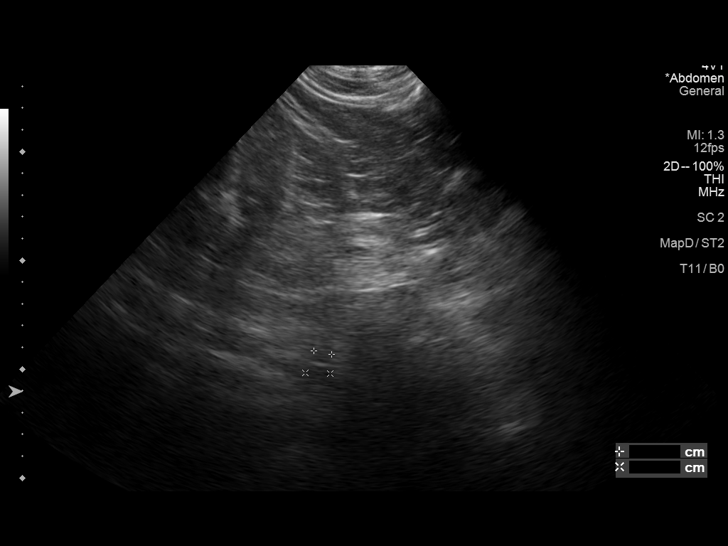
[im 19/21]
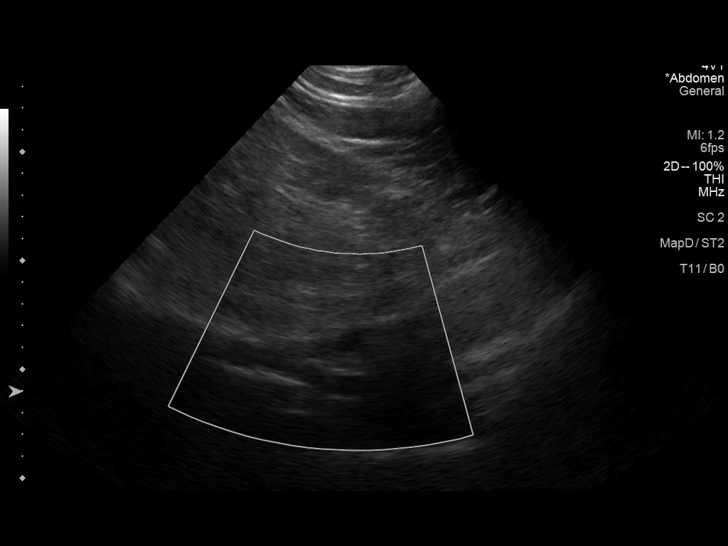
[im 21/21]
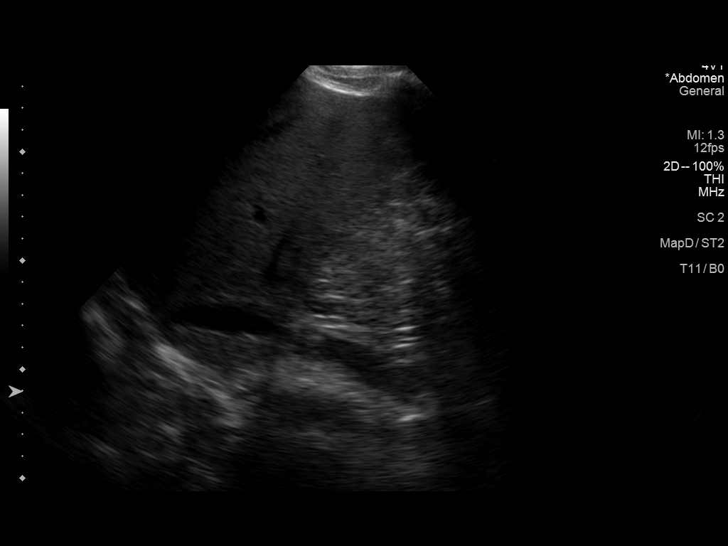

[14 of 21 positions shown; findings below may reference images not displayed]

FINDINGS: Abdominal aortic measurements as follows:

Proximal:  2.6 x 1.8 cm

Mid:  2.2 x 2.2 cm

Distal:  2.0 x 1.6 cm
IMPRESSION: No evidence of abdominal aortic aneurysm. Mildly ectatic proximal
abdominal aorta at risk for aneurysm development. Recommend followup
by ultrasound in 5 years. This recommendation follows ACR consensus
guidelines: White Paper of the ACR Incidental Findings Committee II

## 2021-12-05 DIAGNOSIS — E785 Hyperlipidemia, unspecified: Secondary | ICD-10-CM | POA: Diagnosis not present

## 2021-12-05 DIAGNOSIS — E7800 Pure hypercholesterolemia, unspecified: Secondary | ICD-10-CM | POA: Diagnosis not present

## 2021-12-05 DIAGNOSIS — Z794 Long term (current) use of insulin: Secondary | ICD-10-CM | POA: Diagnosis not present

## 2021-12-05 DIAGNOSIS — E1159 Type 2 diabetes mellitus with other circulatory complications: Secondary | ICD-10-CM | POA: Diagnosis not present

## 2021-12-05 DIAGNOSIS — R1310 Dysphagia, unspecified: Secondary | ICD-10-CM | POA: Diagnosis not present

## 2021-12-05 DIAGNOSIS — Z1211 Encounter for screening for malignant neoplasm of colon: Secondary | ICD-10-CM | POA: Diagnosis not present

## 2021-12-05 DIAGNOSIS — E1169 Type 2 diabetes mellitus with other specified complication: Secondary | ICD-10-CM | POA: Diagnosis not present

## 2021-12-05 DIAGNOSIS — Z8719 Personal history of other diseases of the digestive system: Secondary | ICD-10-CM | POA: Diagnosis not present

## 2021-12-05 DIAGNOSIS — E1142 Type 2 diabetes mellitus with diabetic polyneuropathy: Secondary | ICD-10-CM | POA: Diagnosis not present

## 2021-12-05 DIAGNOSIS — I152 Hypertension secondary to endocrine disorders: Secondary | ICD-10-CM | POA: Diagnosis not present

## 2022-01-10 ENCOUNTER — Other Ambulatory Visit: Payer: Self-pay | Admitting: Surgery

## 2022-01-10 DIAGNOSIS — I7121 Aneurysm of the ascending aorta, without rupture: Secondary | ICD-10-CM

## 2022-02-18 ENCOUNTER — Ambulatory Visit
Admission: RE | Admit: 2022-02-18 | Discharge: 2022-02-18 | Disposition: A | Discharge status: Home / Self Care | Payer: PPO | Source: Ambulatory Visit | Attending: Surgery | Admitting: Surgery

## 2022-02-18 ENCOUNTER — Ambulatory Visit: Payer: PPO | Admitting: Surgical

## 2022-02-18 ENCOUNTER — Other Ambulatory Visit: Payer: Self-pay

## 2022-02-18 VITALS — BP 109/72 | HR 94 | Resp 20 | Ht 70.0 in | Wt 216.0 lb

## 2022-02-18 DIAGNOSIS — I7121 Aneurysm of the ascending aorta, without rupture: Secondary | ICD-10-CM | POA: Diagnosis not present

## 2022-02-18 NOTE — Progress Notes (Signed)
Subjective:    Patient ID: Andrew Roberson, male    DOB: 06-05-1951, 71 y.o.   MRN: [id number]  Chief Complaint  Patient presents with   Thoracic Aortic Aneurysm    1 year f/u with Chest CT    HPI Patient is in today for reevaluation of known thoracic aortic aneurysm.  He was last seen in May by Dr. Julien Girt and maximal diameter at that time was measured at 4.5 cm.  He was scheduled for repeat CT scan on today's date and the results are as noted below.  Today's maximum measurement is 4.1 cm.  He is also noted to have a stable 5 mm pulmonary nodule.  Currently he denies any chest symptoms.  He specifically denies chest pain, shortness of breath or palpitations.  He does have some frequent GI symptoms with alternating bouts of diarrhea and constipation.  This has been a problem for many years apparently.  His diabetes is under poor control with him stating that his most recent hemoglobin A1c was over 10.  He has recently to be treated for prostate cancer as well.  Past Medical History:  Diagnosis Date   Anxiety    Diabetes mellitus without complication ([place])    Prostate cancer ([place])     Past Surgical History:  Procedure Laterality Date   KNEE RECONSTRUCTION Left 1994   PROSTATE BIOPSY      Family History  Problem Relation Age of Onset   Stroke Mother    Dementia Mother    Myelodysplastic syndrome Father    Hypertension Father    Prostate cancer Father    Graves' disease Sister    Liver disease Sister    Breast cancer Neg Hx    Colon cancer Neg Hx    Pancreatic cancer Neg Hx     Social History   Socioeconomic History   Marital status: Single    Spouse name: Not on file   Number of children: 0   Years of education: Not on file   Highest education level: Not on file  Occupational History   Occupation: retired  Tobacco Use   Smoking status: Former    Packs/day: 1.00    Years: 25.00    Pack years: 25.00    Types: Cigarettes    Quit date: 07/14/2007    Years since  quitting: 14.6   Smokeless tobacco: Never  Vaping Use   Vaping Use: Never used  Substance and Sexual Activity   Alcohol use: Yes    Comment: social rare   Drug use: Yes    Types: Marijuana    Comment: social   Sexual activity: Not Currently  Other Topics Concern   Not on file  Social History Narrative   Retired - ran hotels   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   Social Determinants of Health   Financial Resource Strain: Not on file  Food Insecurity: Not on file  Transportation Needs: Not on file  Physical Activity: Not on file  Stress: Not on file  Social Connections: Not on file  Intimate Partner Violence: Not on file    Outpatient Medications Prior to Visit  Medication Sig Dispense Refill   ALPRAZolam (XANAX) 0.25 MG tablet TAKE 1 TABLET BY MOUTH AT BEDTIME AS NEEDED FOR SLEEP     aspirin 81 MG chewable tablet Chew by mouth.     Biotin 5 MG CAPS Take by mouth.  canagliflozin (INVOKANA) 100 MG TABS tablet TAKE ONE TABLET (100 MG DOSE) BY MOUTH DAILY.     clotrimazole (LOTRIMIN) 1 % cream Apply topically.     glucose blood (FREESTYLE LITE) test strip USE TEST STRIPS TO TEST EVERY DAY     Insulin Glargine (LANTUS) 100 UNIT/ML Solostar Pen INJECT 20 UNITS SUBCUTANEOUSLY EVERY DAY 15 mL 3   Insulin Pen Needle (PEN NEEDLES) 31G X 5 MM MISC 2 each by Does not apply route daily. 100 each 6   liraglutide (VICTOZA) 18 MG/3ML SOPN Inject 0.3 mLs (1.8 mg total) into the skin daily. 9 mL 3   lisinopril (PRINIVIL,ZESTRIL) 10 MG tablet Take 1 tablet (10 mg total) by mouth daily. 90 tablet 3   metFORMIN (GLUCOPHAGE-XR) 500 MG 24 hr tablet Take 4 tablets (2,000 mg total) by mouth daily with breakfast. 360 tablet 0   Multiple Vitamin (THERA) TABS Take by mouth.     omeprazole (PRILOSEC) 40 MG capsule Take 1 capsule (40 mg total) by mouth 2 (two) times daily. 180 capsule 3   rosuvastatin (CRESTOR) 5 MG tablet Take 1 tablet (5 mg total) by  mouth daily. 90 tablet 3   tamsulosin (FLOMAX) 0.4 MG CAPS capsule Take 1 capsule (0.4 mg total) by mouth daily. 90 capsule 4   No facility-administered medications prior to visit.    Allergies  Allergen Reactions   Codeine     Other reaction(s): Abdominal Pain    ROS: Per HPI    Objective:    Physical Exam Vitals reviewed.  Constitutional:      General: He is not in acute distress.    Appearance: Normal appearance. He is obese. He is not ill-appearing.  HENT:     Head: Normocephalic and atraumatic.  Cardiovascular:     Rate and Rhythm: Normal rate and regular rhythm.     Heart sounds: No murmur heard.   No gallop.  Pulmonary:     Effort: Pulmonary effort is normal.     Breath sounds: Normal breath sounds. No wheezing, rhonchi or rales.  Chest:     Chest wall: No tenderness.  Abdominal:     Tenderness: There is no abdominal tenderness.     Comments: Obese  Musculoskeletal:     Right lower leg: No edema.     Left lower leg: No edema.  Skin:    Coloration: Skin is not jaundiced or pale.  Neurological:     General: No focal deficit present.     Mental Status: He is alert.  Psychiatric:        Mood and Affect: Mood normal.        Behavior: Behavior normal.    Ht 5\' 10"  (1.778 m)    BMI 30.85 kg/m  Wt Readings from Last 3 Encounters:  05/16/21 215 lb (97.5 kg)  11/17/18 208 lb 12.8 oz (94.7 kg)  10/21/18 206 lb 12.8 oz (93.8 kg)    Health Maintenance Due  Topic Date Due   OPHTHALMOLOGY EXAM  01/27/2019   HEMOGLOBIN A1C  05/18/2019   FOOT EXAM  08/18/2019   COLONOSCOPY (Pts 45-44yrs Insurance coverage will need to be confirmed)  10/14/2019    There are no preventive care reminders to display for this patient.   Lab Results  Component Value Date   TSH 0.892 11/17/2018   Lab Results  Component Value Date   WBC 11.4 (H) 11/17/2018   HGB 14.4 11/17/2018   HCT 43.5 11/17/2018   MCV 87 11/17/2018  PLT 321 11/17/2018   Lab Results  Component Value  Date   NA 136 11/17/2018   K 4.5 11/17/2018   CO2 21 11/17/2018   GLUCOSE 107 (H) 11/17/2018   BUN 22 11/17/2018   CREATININE 1.10 11/17/2018   BILITOT 0.4 11/17/2018   ALKPHOS 79 11/17/2018   AST 10 11/17/2018   ALT 11 11/17/2018   PROT 6.8 11/17/2018   ALBUMIN 4.4 11/17/2018   CALCIUM 9.9 11/17/2018   Lab Results  Component Value Date   CHOL 117 01/26/2018   Lab Results  Component Value Date   HDL 41 01/26/2018   Lab Results  Component Value Date   LDLCALC 48 01/26/2018   Lab Results  Component Value Date   TRIG 141 01/26/2018   Lab Results  Component Value Date   CHOLHDL 2.9 01/26/2018   Lab Results  Component Value Date   HGBA1C 7.3 (A) 11/17/2018      CT CHEST WO CONTRAST  Result Date: 02/18/2022 CLINICAL DATA:  A 71 year old male presents for evaluation of aortic aneurysm. EXAM: CT CHEST WITHOUT CONTRAST TECHNIQUE: Multidetector CT imaging of the chest was performed following the standard protocol without IV contrast. RADIATION DOSE REDUCTION: This exam was performed according to the departmental dose-optimization program which includes automated exposure control, adjustment of the mA and/or kV according to patient size and/or use of iterative reconstruction technique. COMPARISON:  April 19, 2021. FINDINGS: Cardiovascular: Ascending thoracic aorta at 4.1 cm both axial and coronal measurements for maximal caliber showing no change. Descending thoracic aorta measuring 2.7 cm. Calcified atheromatous plaque throughout the thoracic aorta is mild to moderate. Coronary artery calcification. Normal heart size without substantial pericardial effusion. Limited assessment of cardiovascular structures given lack of intravenous contrast. Mediastinum/Nodes: No thoracic inlet, axillary, mediastinal or hilar adenopathy. Esophagus grossly normal. Lungs/Pleura: 4 mm LEFT upper lobe pulmonary nodule is stable (image 22/8) no consolidation or effusion. Airways are patent. Upper Abdomen:  Incidental imaging of upper abdominal contents shows no acute process. No adenopathy in the upper abdomen. Musculoskeletal: No chest wall mass or suspicious bone lesions identified. Signs of spinal degenerative change. IMPRESSION: 1. Ascending thoracic aorta at 4.1 cm both axial and coronal measurements for maximal caliber showing no change. 2. Coronary artery calcification. 3. Stable 5 mm LEFT upper lobe pulmonary nodule. Suggest follow-up in 1 year from the previous evaluation z to document stability. Aortic Atherosclerosis (ICD10-I70.0). Electronically Signed   By: Zetta Bills M.D.   On: 02/18/2022 08:26    Assessment & Plan:   Problem List Items Addressed This Visit     Thoracic aortic aneurysm without rupture - Primary   4.1 cm ascending thoracic aortic aneurysm.  We will continue to follow him with yearly CTA.  He also has a small 5 mm left upper lobe pulmonary nodule that will need continued monitoring.  Heavy emphasis was given on this visit to improving his lifestyle, nutrition and management of his diabetes.  He understands the relationship between this management as it relates to multiple chronic illness issues including potential cardiac disease.  No orders of the defined types were placed in this encounter.    John Giovanni, PA-C

## 2022-04-06 IMAGING — NM NM BONE WHOLE BODY
2 series · 2 of 2 positions shown · non-contrast
Comparison: June 12, 2020.

CLINICAL DATA: History of prostate cancer.

EXAM:
NUCLEAR MEDICINE WHOLE BODY BONE SCAN
TECHNIQUE: Whole body anterior and posterior images were obtained approximately
3 hours after intravenous injection of radiopharmaceutical.
RADIOPHARMACEUTICALS:  21.4 mCi Nechnetium-UUm MDP IV

[Series 1: whole body · 2.66mm/px · 1 of 1 slices shown (1 of 2)]
[im 1/1]
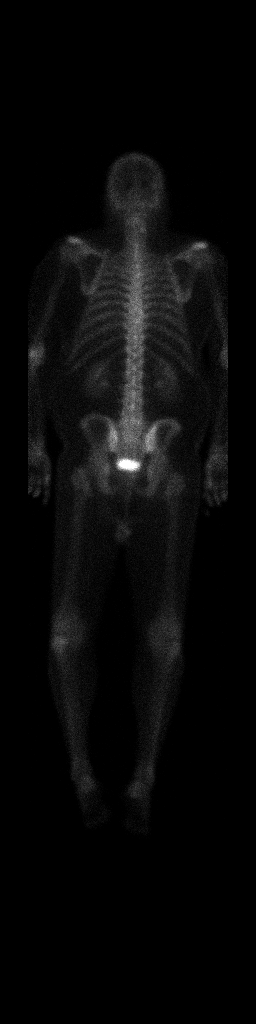

[Series 1: whole body · 2.66mm/px · 1 of 1 slices shown (2 of 2)]
[im 1/1]
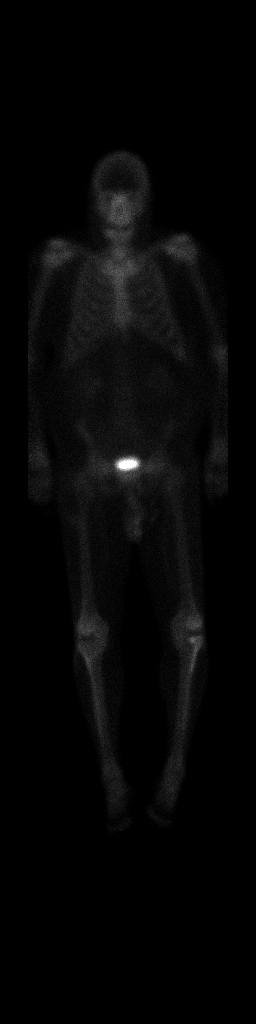

[2 of 2 positions shown; findings below may reference images not displayed]

FINDINGS: Small focus of abnormal uptake is seen involving the left knee most
likely representing degenerative change. No other areas of abnormal
uptake are noted.
IMPRESSION: No definite scintigraphic evidence of osseous metastases.

## 2022-05-16 ENCOUNTER — Emergency Department (HOSPITAL_COMMUNITY)
Admission: EM | Admit: 2022-05-16 | Discharge: 2022-05-16 | Disposition: A | Discharge status: Home / Self Care | Payer: PPO | Attending: Emergency Medicine | Admitting: Emergency Medicine

## 2022-05-16 ENCOUNTER — Other Ambulatory Visit: Payer: Self-pay

## 2022-05-16 ENCOUNTER — Telehealth (HOSPITAL_COMMUNITY): Payer: Self-pay | Admitting: Emergency Medicine

## 2022-05-16 ENCOUNTER — Encounter (HOSPITAL_COMMUNITY): Payer: Self-pay

## 2022-05-16 ENCOUNTER — Emergency Department (HOSPITAL_COMMUNITY): Payer: PPO

## 2022-05-16 DIAGNOSIS — R079 Chest pain, unspecified: Secondary | ICD-10-CM

## 2022-05-16 DIAGNOSIS — E860 Dehydration: Secondary | ICD-10-CM | POA: Diagnosis not present

## 2022-05-16 DIAGNOSIS — E119 Type 2 diabetes mellitus without complications: Secondary | ICD-10-CM | POA: Diagnosis not present

## 2022-05-16 DIAGNOSIS — Z794 Long term (current) use of insulin: Secondary | ICD-10-CM | POA: Diagnosis not present

## 2022-05-16 DIAGNOSIS — R1084 Generalized abdominal pain: Secondary | ICD-10-CM

## 2022-05-16 DIAGNOSIS — F419 Anxiety disorder, unspecified: Secondary | ICD-10-CM | POA: Diagnosis not present

## 2022-05-16 DIAGNOSIS — I10 Essential (primary) hypertension: Secondary | ICD-10-CM | POA: Insufficient documentation

## 2022-05-16 DIAGNOSIS — Z7982 Long term (current) use of aspirin: Secondary | ICD-10-CM | POA: Insufficient documentation

## 2022-05-16 DIAGNOSIS — N179 Acute kidney failure, unspecified: Secondary | ICD-10-CM

## 2022-05-16 DIAGNOSIS — Z8546 Personal history of malignant neoplasm of prostate: Secondary | ICD-10-CM | POA: Diagnosis not present

## 2022-05-16 DIAGNOSIS — Z7984 Long term (current) use of oral hypoglycemic drugs: Secondary | ICD-10-CM | POA: Diagnosis not present

## 2022-05-16 DIAGNOSIS — R112 Nausea with vomiting, unspecified: Secondary | ICD-10-CM

## 2022-05-16 LAB — CBC WITH DIFFERENTIAL/PLATELET
Abs Immature Granulocytes: 0.11 10*3/uL — ABNORMAL HIGH (ref 0.00–0.07)
Basophils Absolute: 0 10*3/uL (ref 0.0–0.1)
Basophils Relative: 0 %
Eosinophils Absolute: 0 10*3/uL (ref 0.0–0.5)
Eosinophils Relative: 0 %
HCT: 47.6 % (ref 39.0–52.0)
Hemoglobin: 15.5 g/dL (ref 13.0–17.0)
Immature Granulocytes: 1 %
Lymphocytes Relative: 5 %
Lymphs Abs: 0.8 10*3/uL (ref 0.7–4.0)
MCH: 27.2 pg (ref 26.0–34.0)
MCHC: 32.6 g/dL (ref 30.0–36.0)
MCV: 83.5 fL (ref 80.0–100.0)
Monocytes Absolute: 0.8 10*3/uL (ref 0.1–1.0)
Monocytes Relative: 5 %
Neutro Abs: 14.3 10*3/uL — ABNORMAL HIGH (ref 1.7–7.7)
Neutrophils Relative %: 89 %
Platelets: 328 10*3/uL (ref 150–400)
RBC: 5.7 MIL/uL (ref 4.22–5.81)
RDW: 16.3 % — ABNORMAL HIGH (ref 11.5–15.5)
WBC: 16 10*3/uL — ABNORMAL HIGH (ref 4.0–10.5)
nRBC: 0 % (ref 0.0–0.2)

## 2022-05-16 LAB — COMPREHENSIVE METABOLIC PANEL
ALT: 17 U/L (ref 0–44)
AST: 18 U/L (ref 15–41)
Albumin: 4.5 g/dL (ref 3.5–5.0)
Alkaline Phosphatase: 105 U/L (ref 38–126)
Anion gap: 21 — ABNORMAL HIGH (ref 5–15)
BUN: 25 mg/dL — ABNORMAL HIGH (ref 8–23)
CO2: 18 mmol/L — ABNORMAL LOW (ref 22–32)
Calcium: 9.9 mg/dL (ref 8.9–10.3)
Chloride: 98 mmol/L (ref 98–111)
Creatinine, Ser: 1.48 mg/dL — ABNORMAL HIGH (ref 0.61–1.24)
GFR, Estimated: 51 mL/min — ABNORMAL LOW (ref >60)
Glucose, Bld: 264 mg/dL — ABNORMAL HIGH (ref 70–99)
Potassium: 4.4 mmol/L (ref 3.5–5.1)
Sodium: 137 mmol/L (ref 135–145)
Total Bilirubin: 1.6 mg/dL — ABNORMAL HIGH (ref 0.3–1.2)
Total Protein: 8.2 g/dL — ABNORMAL HIGH (ref 6.5–8.1)

## 2022-05-16 LAB — URINALYSIS, ROUTINE W REFLEX MICROSCOPIC
Glucose, UA: >=500 mg/dL — AB
Hgb urine dipstick: NEGATIVE
Ketones, ur: >80 mg/dL — AB
Leukocytes,Ua: NEGATIVE
Nitrite: NEGATIVE
Protein, ur: NEGATIVE mg/dL
Specific Gravity, Urine: <1.005 — ABNORMAL LOW (ref 1.005–1.030)
pH: 5.5 (ref 5.0–8.0)

## 2022-05-16 LAB — URINALYSIS, MICROSCOPIC (REFLEX)
Bacteria, UA: NONE SEEN
RBC / HPF: NONE SEEN RBC/hpf (ref 0–5)
Squamous Epithelial / HPF: NONE SEEN (ref 0–5)
WBC, UA: NONE SEEN WBC/hpf (ref 0–5)

## 2022-05-16 LAB — LACTIC ACID, PLASMA
Lactic Acid, Venous: 2.7 mmol/L (ref 0.5–1.9)
Lactic Acid, Venous: 1.8 mmol/L (ref 0.5–1.9)

## 2022-05-16 LAB — LIPASE, BLOOD: Lipase: 24 U/L (ref 11–51)

## 2022-05-16 LAB — TROPONIN I (HIGH SENSITIVITY)
Troponin I (High Sensitivity): 5 ng/L (ref <18)
Troponin I (High Sensitivity): 5 ng/L (ref <18)

## 2022-05-16 MED ORDER — SODIUM CHLORIDE 0.9 % IV BOLUS
1000.0000 mL | Freq: Once | INTRAVENOUS | Status: AC
  Administered 2022-05-16: 1000 mL via INTRAVENOUS

## 2022-05-16 MED ORDER — ONDANSETRON HCL 4 MG PO TABS
4.0000 mg | ORAL_TABLET | Freq: Three times a day (TID) | ORAL | 0 refills | Status: DC | PRN
Start: 2022-05-16 — End: 2024-03-16

## 2022-05-16 MED ORDER — ONDANSETRON HCL 4 MG PO TABS: 4.0000 mg | ORAL_TABLET | Freq: Three times a day (TID) | ORAL | 0 refills | Status: AC | PRN

## 2022-05-16 MED ORDER — ONDANSETRON HCL 4 MG/2ML IJ SOLN
4.0000 mg | Freq: Once | INTRAMUSCULAR | Status: AC
  Administered 2022-05-16: 4 mg via INTRAVENOUS
  Filled 2022-05-16: qty 2

## 2022-05-16 MED ORDER — SODIUM CHLORIDE 0.9 % IV BOLUS
500.0000 mL | Freq: Once | INTRAVENOUS | Status: AC
  Administered 2022-05-16: 500 mL via INTRAVENOUS

## 2022-05-16 MED ORDER — IOHEXOL 350 MG/ML SOLN
100.0000 mL | Freq: Once | INTRAVENOUS | Status: AC | PRN
  Administered 2022-05-16: 100 mL via INTRAVENOUS

## 2022-05-16 NOTE — ED Provider Notes (Signed)
[facility] EMERGENCY DEPARTMENT Provider Note   CSN: [id number] Arrival date & time: 05/16/22  0856     History  Chief Complaint  Patient presents with   Abdominal Pain    Andrew Roberson is a 70 y.o. male.  The history is provided by the patient, the spouse and medical records. No language interpreter was used.  Abdominal Pain Pain location:  Generalized Pain quality: aching   Pain radiates to:  Chest Pain severity:  Moderate Onset quality:  Gradual Duration:  2 days Timing:  Intermittent Progression:  Waxing and waning Chronicity:  New Context: not sick contacts and not suspicious food intake   Relieved by:  Nothing Worsened by:  Palpation and eating Ineffective treatments:  None tried Associated symptoms: chest pain, fatigue, nausea and vomiting   Associated symptoms: no chills, no constipation, no cough, no diarrhea, no dysuria, no fever, no flatus and no shortness of breath       Home Medications Prior to Admission medications   Medication Sig Start Date End Date Taking? Authorizing Provider  ALPRAZolam (XANAX) 0.25 MG tablet TAKE 1 TABLET BY MOUTH AT BEDTIME AS NEEDED FOR SLEEP 05/21/20   [provider]  aspirin 81 MG chewable tablet Chew by mouth.    [provider]  Biotin 5 MG CAPS Take by mouth.    [provider]  canagliflozin (INVOKANA) 100 MG TABS tablet TAKE ONE TABLET (100 MG DOSE) BY MOUTH DAILY. 05/25/20   [provider]  clotrimazole (LOTRIMIN) 1 % cream Apply topically. 05/19/19   [provider]  glucose blood (FREESTYLE LITE) test strip USE TEST STRIPS TO TEST EVERY DAY 02/21/20   [provider]  Insulin Glargine (LANTUS) 100 UNIT/ML Solostar Pen INJECT 20 UNITS SUBCUTANEOUSLY EVERY DAY 04/22/18   Weber, Damaris Hippo, PA-C  Insulin Pen Needle (PEN NEEDLES) 31G X 5 MM MISC 2 each by Does not apply route daily. 04/14/18   Weber, Damaris Hippo, PA-C  liraglutide (VICTOZA) 18 MG/3ML SOPN Inject  0.3 mLs (1.8 mg total) into the skin daily. 05/14/18   Weber, Damaris Hippo, PA-C  lisinopril (PRINIVIL,ZESTRIL) 10 MG tablet Take 1 tablet (10 mg total) by mouth daily. 05/14/18   Gale Journey, Damaris Hippo, PA-C  metFORMIN (GLUCOPHAGE-XR) 500 MG 24 hr tablet Take 4 tablets (2,000 mg total) by mouth daily with breakfast. 11/17/18   Tenna Delaine D, PA-C  Multiple Vitamin (THERA) TABS Take by mouth.    [provider]  omeprazole (PRILOSEC) 40 MG capsule Take 1 capsule (40 mg total) by mouth 2 (two) times daily. 08/17/18   Weber, Damaris Hippo, PA-C  rosuvastatin (CRESTOR) 5 MG tablet Take 1 tablet (5 mg total) by mouth daily. 05/14/18   Gale Journey, Damaris Hippo, PA-C  tamsulosin (FLOMAX) 0.4 MG CAPS capsule Take 1 capsule (0.4 mg total) by mouth daily. 05/14/18   Weber, Damaris Hippo, PA-C      Allergies    Codeine    Review of Systems   Review of Systems  Constitutional:  Positive for fatigue. Negative for chills, diaphoresis and fever.  HENT:  Negative for congestion.   Eyes:  Negative for visual disturbance.  Respiratory:  Negative for cough, chest tightness, shortness of breath, wheezing and stridor.   Cardiovascular:  Positive for chest pain. Negative for palpitations and leg swelling.  Gastrointestinal:  Positive for abdominal pain, nausea and vomiting. Negative for abdominal distention, constipation, diarrhea and flatus.  Genitourinary:  Positive for decreased urine volume. Negative for dysuria and  flank pain.  Musculoskeletal:  Negative for back pain, neck pain and neck stiffness.  Skin:  Negative for rash and wound.  Neurological:  Negative for headaches.  Psychiatric/Behavioral:  Negative for agitation and confusion.   All other systems reviewed and are negative.  Physical Exam Updated Vital Signs BP (!) 148/80   Pulse 89   Temp 98.4 F (36.9 C) (Oral)   Resp (!) 21   SpO2 100%  Physical Exam Vitals and nursing note reviewed.  Constitutional:      General: He is not in acute distress.     Appearance: He is well-developed. He is not ill-appearing, toxic-appearing or diaphoretic.  HENT:     Head: Normocephalic and atraumatic.  Eyes:     Extraocular Movements: Extraocular movements intact.     Conjunctiva/sclera: Conjunctivae normal.     Pupils: Pupils are equal, round, and reactive to light.  Cardiovascular:     Rate and Rhythm: Normal rate and regular rhythm.     Heart sounds: No murmur heard. Pulmonary:     Effort: Pulmonary effort is normal. No respiratory distress.     Breath sounds: Normal breath sounds. No wheezing, rhonchi or rales.  Chest:     Chest wall: No tenderness.  Abdominal:     General: Abdomen is flat. Bowel sounds are normal.     Palpations: Abdomen is soft.     Tenderness: There is generalized abdominal tenderness. There is no right CVA tenderness, left CVA tenderness, guarding or rebound.  Musculoskeletal:        General: No swelling.     Cervical back: Neck supple.  Skin:    General: Skin is warm and dry.     Capillary Refill: Capillary refill takes less than 2 seconds.  Neurological:     General: No focal deficit present.     Mental Status: He is alert.  Psychiatric:        Mood and Affect: Mood is anxious.    ED Results / Procedures / Treatments   Labs (all labs ordered are listed, but only abnormal results are displayed) Labs Reviewed  CBC WITH DIFFERENTIAL/PLATELET - Abnormal; Notable for the following components:      Result Value   WBC 16.0 (*)    RDW 16.3 (*)    Neutro Abs 14.3 (*)    Abs Immature Granulocytes 0.11 (*)    All other components within normal limits  COMPREHENSIVE METABOLIC PANEL - Abnormal; Notable for the following components:   CO2 18 (*)    Glucose, Bld 264 (*)    BUN 25 (*)    Creatinine, Ser 1.48 (*)    Total Protein 8.2 (*)    Total Bilirubin 1.6 (*)    GFR, Estimated 51 (*)    Anion gap 21.0 (*)    All other components within normal limits  LACTIC ACID, PLASMA - Abnormal; Notable for the following  components:   Lactic Acid, Venous 2.7 (*)    All other components within normal limits  URINALYSIS, ROUTINE W REFLEX MICROSCOPIC - Abnormal; Notable for the following components:   Specific Gravity, Urine <1.005 (*)    Glucose, UA >=500 (*)    Bilirubin Urine SMALL (*)    Ketones, ur >80 (*)    All other components within normal limits  URINE CULTURE  LIPASE, BLOOD  LACTIC ACID, PLASMA  URINALYSIS, MICROSCOPIC (REFLEX)  TROPONIN I (HIGH SENSITIVITY)  TROPONIN I (HIGH SENSITIVITY)    EKG EKG Interpretation  Date/Time:  Friday May 16 2022 09:34:23 EDT Ventricular Rate:  90 PR Interval:  128 QRS Duration: 97 QT Interval:  358 QTC Calculation: 438 R Axis:   7 Text Interpretation: Sinus rhythm Borderline low voltage, extremity leads no prior ECG for comparison. No STEMI Confirmed by Antony Blackbird [phone]) on 05/16/2022 10:01:39 AM  Radiology CT Angio Chest/Abd/Pel for Dissection W and/or Wo Contrast  Result Date: 05/16/2022 CLINICAL DATA:  Chest pain, abdominal pain common nausea and vomiting. Known thoracic aortic aneurysm. EXAM: CT ANGIOGRAPHY CHEST, ABDOMEN AND PELVIS TECHNIQUE: Non-contrast CT of the chest was initially obtained. Multidetector CT imaging through the chest, abdomen and pelvis was performed using the standard protocol during bolus administration of intravenous contrast. Multiplanar reconstructed images and MIPs were obtained and reviewed to evaluate the vascular anatomy. RADIATION DOSE REDUCTION: This exam was performed according to the departmental dose-optimization program which includes automated exposure control, adjustment of the mA and/or kV according to patient size and/or use of iterative reconstruction technique. CONTRAST:  118m OMNIPAQUE IOHEXOL 350 MG/ML SOLN COMPARISON:  CT February 18, 2022 and June 12, 2020. FINDINGS: CTA CHEST FINDINGS Cardiovascular: Noncontrast sequence demonstrates aortic atherosclerosis and coronary artery calcifications without  intramural thoracic hematoma. Preferential opacification of the thoracic aorta postcontrast administration. Ascending thoracic aortic aneurysm measures 4.3 cm previously 4.1 cm, with differences in measurement at least somewhat related to increased motion on this examination. Normal heart size. No pericardial effusion. Mediastinum/Nodes: No enlarged mediastinal, hilar, or axillary lymph nodes. Thyroid gland, trachea, and esophagus demonstrate no significant findings. Lungs/Pleura: Unchanged size of the 4 mm left upper lobe pulmonary nodule on image 14/7. No new suspicious pulmonary nodules or masses. No focal airspace consolidation. No pleural effusion. No pneumothorax Musculoskeletal: No acute osseous abnormality. Review of the MIP images confirms the above findings. CTA ABDOMEN AND PELVIS FINDINGS VASCULAR Aorta: Aortic atherosclerosis. Normal caliber aorta without aneurysm, dissection, vasculitis or significant stenosis. Celiac: Patent without evidence of aneurysm, dissection, vasculitis or significant stenosis. SMA: Patent without evidence of aneurysm, dissection, vasculitis or significant stenosis. Renals: Renal arteries are patent without evidence of aneurysm, dissection, vasculitis, fibromuscular dysplasia or significant stenosis. IMA: Patent without evidence of aneurysm, dissection, vasculitis or significant stenosis. Inflow: Patent without evidence of aneurysm, dissection, vasculitis or significant stenosis. Veins: No obvious venous abnormality within the limitations of this arterial phase study. Review of the MIP images confirms the above findings. NON-VASCULAR Hepatobiliary: No focal liver abnormality is seen. No gallstones, gallbladder wall thickening, or biliary dilatation. Pancreas: Unremarkable. No pancreatic ductal dilatation or surrounding inflammatory changes. Spleen: Normal in size without focal abnormality. Adrenals/Urinary Tract: Adrenal glands are unremarkable. Kidneys are normal, without renal  calculi, focal lesion, or hydronephrosis. Bladder is unremarkable. Stomach/Bowel: Stomach is within normal limits. Multiple appendicoliths within a nondilated noninflamed appendix. No evidence of bowel wall thickening, distention, or inflammatory changes. Lymphatic: No pathologically enlarged abdominal or pelvic lymph nodes. Reproductive: Brachytherapy seeds in the prostate gland. Other: No significant abdominopelvic free fluid. Musculoskeletal: L1 vertebral body hemangioma. No acute osseous abnormality. Multilevel degenerative changes spine. Review of the MIP images confirms the above findings. IMPRESSION: 1. Ascending thoracic aortic aneurysm measures up to 4.3 cm, previously 4.1 cm, with differences in measurement at least somewhat related to increased motion on this examination. Recommend annual imaging followup by CTA or MRA. This recommendation follows 2010 ACCF/AHA/AATS/ACR/ASA/SCA/SCAI/SIR/STS/SVM Guidelines for the Diagnosis and Management of Patients with Thoracic Aortic Disease. Circulation. 2010; 121:: I433-I951 Aortic aneurysm NOS (ICD10-I71.9) 2. No acute process in the chest, abdomen or pelvis. 3. Stable left  upper lobe pulmonary nodule again suggest follow-up imaging in 1 year from prior chest CT. 4.  Aortic Atherosclerosis (ICD10-I70.0). Electronically Signed   By: Dahlia Bailiff M.D.   On: 05/16/2022 12:08    Procedures Procedures    Medications Ordered in ED Medications  ondansetron (ZOFRAN) injection 4 mg (4 mg Intravenous Given 05/16/22 0943)  sodium chloride 0.9 % bolus 1,000 mL (0 mLs Intravenous Stopped 05/16/22 1205)  iohexol (OMNIPAQUE) 350 MG/ML injection 100 mL (100 mLs Intravenous Contrast Given 05/16/22 1147)  sodium chloride 0.9 % bolus 500 mL (0 mLs Intravenous Stopped 05/16/22 1301)    ED Course/ Medical Decision Making/ A&P                           Medical Decision Making Problems Addressed: Chest pain, unspecified type: acute illness or injury that poses a threat to  life or bodily functions Generalized abdominal pain: acute illness or injury Nausea and vomiting, unspecified vomiting type: acute illness or injury  Amount and/or Complexity of Data Reviewed Labs: ordered. Radiology: ordered. ECG/medicine tests: ordered and independent interpretation performed.  Risk Prescription drug management.   Andrew Roberson is a 71 y.o. male with a past medical history significant for hypertension, prostate cancer, diabetes, anxiety, and known ascending aortic aneurysm who presents with 2 days of nausea, vomiting, intermittent abdominal discomfort, and intermittent chest discomfort.  Patient reports that patient's symptoms began yesterday and have been persistent since.  He has been dry heaving and not keeping anything down.  He reports he has had a bowel movement or 2 without any constipation or diarrhea but reports he is intermittently had abdominal pain and chest discomfort.  He reports like a pressure and tightness in his chest.  It otherwise does not radiate.  He states has not had this type of nausea and vomiting in some time.  Denies fevers, chills, or rashes.  He reports he has had less urination since he is not keeping any fluids down.  He does not think his abdomen is distended.  No pain in his flank or back.  On exam, lungs were clear.  Did not appreciate a murmur.  Chest and abdomen were nontender.  Bowel sounds were appreciated.  Flanks nontender.   Patient will be worked up for possible life-threatening abnormality with pain in his chest abdomen and no known aneurysm.  Differential diagnosis includes viral gastroenteritis, ACS, aortic changes such as dissection or aneurysm worsening, bowel obstruction, or other surgical problem.  We will get labs and get a CT dissection study to look for all the different etiologies we are considering.  We will give the patient some nausea medicine and fluids.  He is not having current pain so we will hold on pain medicine.   We will keep him on the monitor.  Anticipate reassessment after work-up to determine disposition  2:21 PM Patient's work-up began to return.  CT scan shows aorta is very similar to before at 4.3 from 4.1.  I called and spoke with the radiologist myself and went through the images together and they feel it is very similar.  They do not see other concerning findings.  They feel there is no abnormality around the kidneys or other acute abnormality.  Patient felt much better after fluids and nausea medicine.  Work-up showed evidence of dehydration but otherwise overall reassuring.  No evidence of UTI.  Troponin negative x2.  Patient has a leukocytosis which she reports he "always  has" and is unconcerned about it.  Metabolic panel shows elevated creatinine from prior likely read to his dehydration but otherwise is reassuring.  Anion gap likely related to the lactic acidosis he presented with but has improved after fluids.  It is now normal.  Given his improvement in symptoms, we attempted a p.o. challenge and he ate and drink well.  He is feeling much better.  Suspect viral gastroenteritis as the cause of symptoms with dehydration.  Given his reassuring work-up, like to go home.  We will have him follow-up with the PCP and given prescription for nausea medicine.  Patient agreed with plan of care and with questions or concerns and was discharged in good condition.        Final Clinical Impression(s) / ED Diagnoses Final diagnoses:  Generalized abdominal pain  Nausea and vomiting, unspecified vomiting type  Chest pain, unspecified type  Dehydration  AKI (acute kidney injury) ([place])    Rx / DC Orders ED Discharge Orders          Ordered    ondansetron (ZOFRAN) 4 MG tablet  Every 8 hours PRN        05/16/22 1423            Clinical Impression: 1. Generalized abdominal pain   2. Nausea and vomiting, unspecified vomiting type   3. Chest pain, unspecified type   4. Dehydration   5.  AKI (acute kidney injury) ([place])     Disposition: Discharge  Condition: Good  I have discussed the results, Dx and Tx plan with the pt(& family if present). He/she/they expressed understanding and agree(s) with the plan. Discharge instructions discussed at great length. Strict return precautions discussed and pt &/or family have verbalized understanding of the instructions. No further questions at time of discharge.    New Prescriptions   ONDANSETRON (ZOFRAN) 4 MG TABLET    Take 1 tablet (4 mg total) by mouth every 8 (eight) hours as needed for nausea or vomiting.    Follow Up: Mancel Bale, PA-C [place] [id number] [phone]     [facility] EMERGENCY DEPARTMENT [address] [phone] mc [place]          Aina Rossbach, Gwenyth Allegra, MD 05/16/22 [phone]

## 2022-05-16 NOTE — Discharge Instructions (Signed)
Your history, exam, work-up today were overall reassuring.  Your aneurysm is very similar in size to prior.  Nausea and vomiting was related to a viral gastroenteritis or GI bug as we discussed as it has been around the community.  Otherwise we do not see any concerning findings other than likely dehydration.  Please rest and stay hydrated and use the nausea medicine if needed.  Please follow-up with a primary doctor.  If any symptoms change or worsen acutely, please return to the nearest emergency department.

## 2022-05-16 NOTE — Telephone Encounter (Signed)
4:40 PM Patient friend called to report that they could not afford the medication at the prescription it was initially sent.  We will try to send to [facility] with a good Rx code to help save money for patient.

## 2022-05-16 NOTE — ED Triage Notes (Signed)
Here from home for N/V that started yesterday. 1 episode of vomiting with EMS wo blood. A&O X4. VSS. CBG 244

## 2022-05-17 LAB — URINE CULTURE: Culture: <10000 — AB

## 2023-01-12 ENCOUNTER — Other Ambulatory Visit: Payer: Self-pay | Admitting: Surgery

## 2023-01-12 DIAGNOSIS — I7121 Aneurysm of the ascending aorta, without rupture: Secondary | ICD-10-CM

## 2023-02-19 ENCOUNTER — Ambulatory Visit: Payer: PPO

## 2023-02-19 ENCOUNTER — Ambulatory Visit
Admission: RE | Admit: 2023-02-19 | Discharge: 2023-02-19 | Disposition: A | Discharge status: Home / Self Care | Payer: PPO | Source: Ambulatory Visit | Attending: Surgery | Admitting: Surgery

## 2023-02-19 DIAGNOSIS — I7121 Aneurysm of the ascending aorta, without rupture: Secondary | ICD-10-CM

## 2023-02-19 MED ORDER — IOPAMIDOL (ISOVUE-370) INJECTION 76%
75.0000 mL | Freq: Once | INTRAVENOUS | Status: AC | PRN
  Administered 2023-02-19: 75 mL via INTRAVENOUS

## 2023-03-04 ENCOUNTER — Encounter: Payer: Self-pay | Admitting: Surgery

## 2023-03-04 ENCOUNTER — Ambulatory Visit: Payer: PPO | Admitting: Surgery

## 2023-03-04 VITALS — BP 129/76 | HR 74 | Resp 20 | Ht 70.0 in | Wt 202.0 lb

## 2023-03-04 DIAGNOSIS — I7121 Aneurysm of the ascending aorta, without rupture: Secondary | ICD-10-CM

## 2023-03-04 NOTE — Progress Notes (Signed)
HPI:  The patient is a 72 year old gentleman with a history of diabetes and a fusiform ascending aortic aneurysm that has measured between 4.1 and 4.5 cm on CT scans over the past few years.  He was last seen by one of our PAs on 02/18/2022 at which time CT scan of the chest showed the aneurysm measured 4.2 cm.  He denies any chest pain or shortness of breath.  Current Outpatient Medications  Medication Sig Dispense Refill   aspirin 81 MG chewable tablet Chew 81 mg by mouth daily.     Biotin 5 MG CAPS Take 1 capsule by mouth daily.     clotrimazole (LOTRIMIN) 1 % cream Apply 1 application. topically as needed (yeast infection).     Insulin Glargine (LANTUS) 100 UNIT/ML Solostar Pen INJECT 20 UNITS SUBCUTANEOUSLY EVERY DAY (Patient taking differently: Inject 24 Units into the skin daily.) 15 mL 3   Insulin Pen Needle (PEN NEEDLES) 31G X 5 MM MISC 2 each by Does not apply route daily. 100 each 6   JARDIANCE 25 MG TABS tablet Take 25 mg by mouth daily.     lisinopril (PRINIVIL,ZESTRIL) 10 MG tablet Take 1 tablet (10 mg total) by mouth daily. 90 tablet 3   omeprazole (PRILOSEC) 40 MG capsule Take 1 capsule (40 mg total) by mouth 2 (two) times daily. (Patient taking differently: Take 40 mg by mouth daily.) 180 capsule 3   ondansetron (ZOFRAN) 4 MG tablet Take 1 tablet (4 mg total) by mouth every 8 (eight) hours as needed for nausea or vomiting. 12 tablet 0   rosuvastatin (CRESTOR) 5 MG tablet Take 1 tablet (5 mg total) by mouth daily. 90 tablet 3   tamsulosin (FLOMAX) 0.4 MG CAPS capsule Take 1 capsule (0.4 mg total) by mouth daily. 90 capsule 4   glucose blood (FREESTYLE LITE) test strip USE TEST STRIPS TO TEST EVERY DAY     liraglutide (VICTOZA) 18 MG/3ML SOPN Inject 0.3 mLs (1.8 mg total) into the skin daily. 9 mL 3   metFORMIN (GLUCOPHAGE-XR) 500 MG 24 hr tablet Take 4 tablets (2,000 mg total) by mouth daily with breakfast. 360 tablet 0   ondansetron (ZOFRAN) 4 MG tablet Take 1 tablet (4 mg  total) by mouth every 8 (eight) hours as needed for nausea or vomiting. 12 tablet 0   No current facility-administered medications for this visit.     Physical Exam: BP 129/76   Pulse 74   Resp 20   Ht '5\' 10"'$  (1.778 m)   Wt 202 lb (91.6 kg)   SpO2 98% Comment: RA  BMI 28.98 kg/m  He looks well. Cardiac exam shows a regular rate and rhythm with normal heart sounds.  There is no murmur. Lungs are clear. There is no peripheral edema.  Diagnostic Tests:  Narrative & Impression  CLINICAL DATA:  Evaluate for thoracic aortic aneurysm. History of prostate cancer.   EXAM: CT ANGIOGRAPHY CHEST WITH CONTRAST   TECHNIQUE: Multidetector CT imaging of the chest was performed using the standard protocol during bolus administration of intravenous contrast. Multiplanar CT image reconstructions and MIPs were obtained to evaluate the vascular anatomy.   RADIATION DOSE REDUCTION: This exam was performed according to the departmental dose-optimization program which includes automated exposure control, adjustment of the mA and/or kV according to patient size and/or use of iterative reconstruction technique.   CONTRAST:  25m ISOVUE-370 IOPAMIDOL (ISOVUE-370) INJECTION 76%   COMPARISON:  05/16/2022; 02/18/2022; 04/19/2021; CT abdomen pelvis-06/12/2020   FINDINGS: Vascular  Findings:   Stable mild fusiform aneurysmal dilatation of the ascending thoracic aorta was measurements as follows. The thoracic aorta tapers to a normal caliber at the level of the aortic arch. There is a minimal amount of eccentric calcified atherosclerotic plaque involving the posterior aspect of the aortic arch as well as the proximal descending thoracic aorta, not resulting in hemodynamically significant stenosis. The descending thoracic aorta is of normal caliber. No evidence of thoracic aortic dissection or perivascular stranding on this nongated examination   Conventional configuration of the aortic arch.  The branch vessels of the aortic arch appear widely patent throughout their imaged courses.   Normal heart size. Coronary artery calcifications. No pericardial effusion though small amount of fluid is seen with the pericardial recess.   Although this examination was not tailored for the evaluation the pulmonary arteries, there are no discrete filling defects within the central pulmonary arterial tree to suggest central pulmonary embolism. Normal caliber of the main pulmonary artery.   -------------------------------------------------------------   Thoracic aortic measurements:   SINOTUBULAR JUNCTION: 36 mm as measured in greatest oblique short axis coronal dimension.   PROXIMAL ASCENDING THORACIC AORTA: 42 mm as measured in greatest oblique short axis axial dimension (axial image 62, series 4) at the level of the main pulmonary artery and approximately 41 mm as measured in greatest oblique short axis coronal dimension (coronal image 69, series 9), unchanged compared to the 03/2021 examination   AORTIC ARCH: 31 mm as measured in greatest oblique short axis sagittal dimension.   PROXIMAL DESCENDING THORACIC AORTA: 26 mm as measured in greatest oblique short axis axial dimension at the level of the main pulmonary artery.   DISTAL DESCENDING THORACIC AORTA: 27 mm as measured in greatest oblique short axis axial dimension at the level of the diaphragmatic hiatus.   Review of the MIP images confirms the above findings.   -------------------------------------------------------------   Non-Vascular Findings:   Mediastinum/Lymph Nodes: No bulky mediastinal, hilar or axillary lymph adenopathy.   Lungs/Pleura: Mild apical predominant centrilobular emphysematous change. No discrete focal airspace opacities. No pleural effusion or pneumothorax. The central pulmonary airways appear widely patent.   No new or enlarging pulmonary nodules. Punctate (0.4 cm) left apical pulmonary  nodule (image 16, series 6) as well as the punctate (2 mm) right upper lobe pulmonary nodule (image 38, series 6) are both unchanged compared to the 03/2021 examination.   There is an approximately 0.6 cm calcified granuloma involving the medial aspect of the left hemidiaphragm (image 118, series 6), as well as a punctate (1-2 mm) right apical granuloma (image 17, series 6), both of which are unchanged compared to the 03/2021 examination.   Upper abdomen: Limited early arterial phase evaluation of the upper abdomen demonstrates mild nodularity of the hepatic parenchyma as could be seen in the setting of early cirrhotic change. No discrete hyperenhancing hepatic lesions. Note is made of a small splenule.   Musculoskeletal: No acute or aggressive osseous abnormalities. Stigmata of dish within the midthoracic spine. Regional soft tissues appear normal. There is a punctate (approximately 0.5 cm) hypoattenuating nodule within the left lobe of the thyroid which is unchanged compared to the 04/2022 examination of doubtful clinical concern.   IMPRESSION: 1. Stable uncomplicated mild fusiform aneurysmal dilatation of the ascending thoracic aorta measuring 42 mm in diameter, unchanged compared to the 03/2021 examination. Recommend annual imaging followup by CTA or MRA. This recommendation follows 2010 ACCF/AHA/AATS/ACR/ASA/SCA/SCAI/SIR/STS/SVM Guidelines for the Diagnosis and Management of Patients with Thoracic Aortic Disease. Circulation. 2010;  121: [id number]. Aortic aneurysm NOS (ICD10-I71.9) 2. Coronary artery calcifications. Aortic Atherosclerosis (ICD10-I70.0). 3. Punctate (sub 4 mm) left apical and right upper lobe pulmonary nodules are unchanged compared to the 03/2021 examination. This examination documents nearly 2 years of stability, findings suggestive of a benign etiology. 4. No worrisome new or enlarging pulmonary nodules. 5. Mild nodularity of the hepatic contour as could be  seen in the setting of early cirrhotic change. Correlation with LFTs advised. 6. Emphysema (ICD10-J43.9).     Electronically Signed   By: Sandi Mariscal M.D.   On: 02/19/2023 16:43      Impression: This 72 year old gentleman has a stable 4.2 cm fusiform ascending aortic aneurysm.  This is well below the surgical threshold of 5.5 cm.  I reviewed the CTA images with him and his wife and answered their questions.  I stressed the importance of continued good blood pressure control in preventing further enlargement and acute aortic dissection.  I advised him against doing any heavy lifting that may require a Valsalva maneuver and could suddenly raise his blood pressure to high levels.  I also recommended that he check his blood pressure at home and keep a chart.  His CT scan also showed tiny nodules in the left apical and right upper lobes that are unchanged compared to CT in April 2022 and most likely benign.  Plan:  I will see him back in 1 year with a CTA of the chest for aortic surveillance.  I spent 15 minutes performing this established patient evaluation and > 50% of this time was spent face to face counseling and coordinating the care of this patient's aortic aneurysm.    Gaye Pollack, MD [facility] [phone]

## 2024-02-09 ENCOUNTER — Other Ambulatory Visit: Payer: Self-pay | Admitting: Surgery

## 2024-02-09 DIAGNOSIS — I712 Thoracic aortic aneurysm, without rupture, unspecified: Secondary | ICD-10-CM

## 2024-02-19 ENCOUNTER — Other Ambulatory Visit: Payer: Self-pay | Admitting: Medical Genetics

## 2024-03-09 ENCOUNTER — Ambulatory Visit
Admission: RE | Admit: 2024-03-09 | Discharge: 2024-03-09 | Disposition: A | Discharge status: Home / Self Care | Payer: PPO | Source: Ambulatory Visit | Attending: Surgery | Admitting: Surgery

## 2024-03-09 DIAGNOSIS — I712 Thoracic aortic aneurysm, without rupture, unspecified: Secondary | ICD-10-CM

## 2024-03-09 IMAGING — CT CT ANGIO CHEST-ABD-PELV FOR DISSECTION W/ AND WO/W CM
2 of 7 series · 13 of 46 positions shown, 15 images · non-contrast
Comparison: CT February 18, 2022 and June 12, 2020.

CLINICAL DATA: Chest pain, abdominal pain common nausea and
vomiting. Known thoracic aortic aneurysm.

EXAM:
CT ANGIOGRAPHY CHEST, ABDOMEN AND PELVIS
TECHNIQUE: Non-contrast CT of the chest was initially obtained.

[Series 5: dissection 3.0 i30f 3 · axial · 0.76mm/px · z∈[+850,+1398]mm · 10 of 215 slices shown, 12 images]
[im 16/215  soft-tissue]
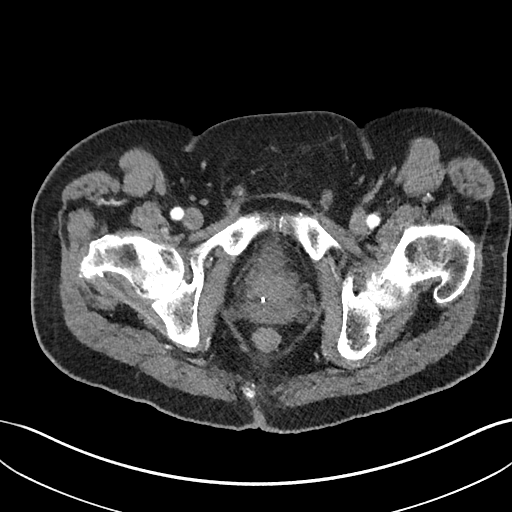
[im 16/215  bone]
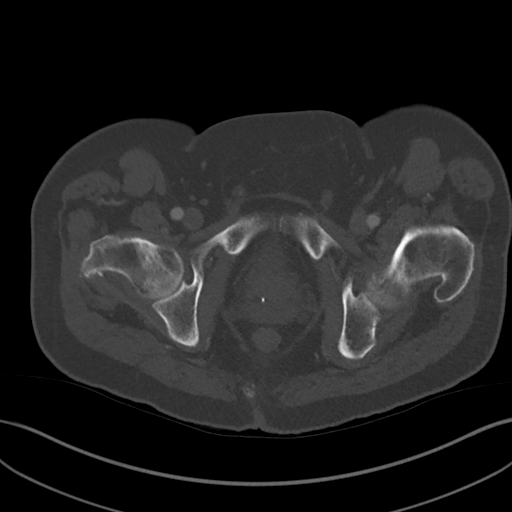
[im 31/215  soft-tissue]
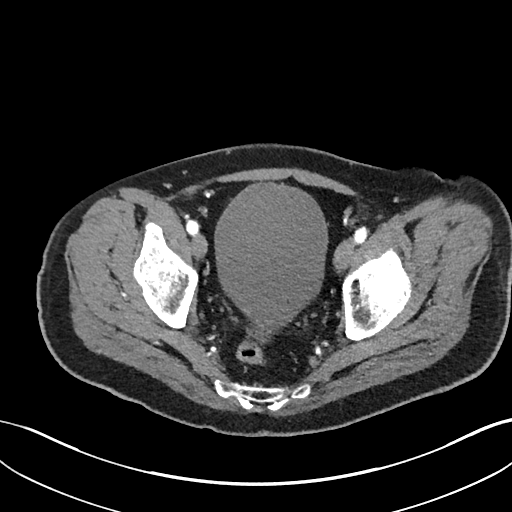
[im 62/215  soft-tissue]
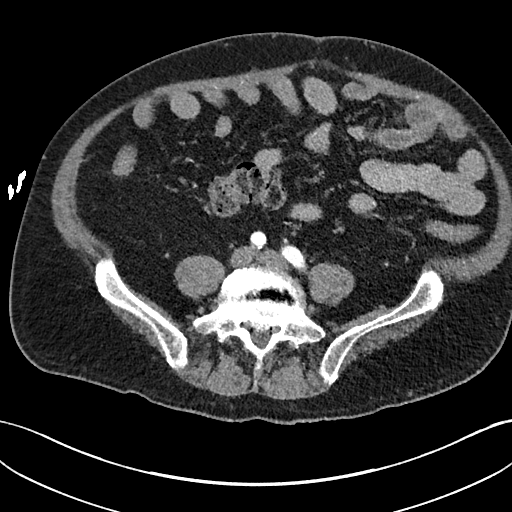
[im 77/215  soft-tissue]
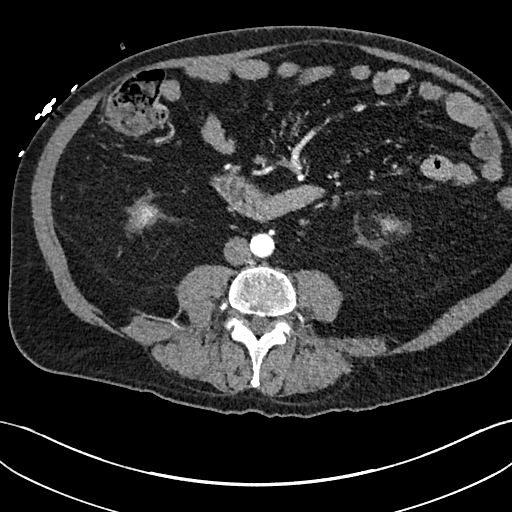
[im 92/215  soft-tissue]
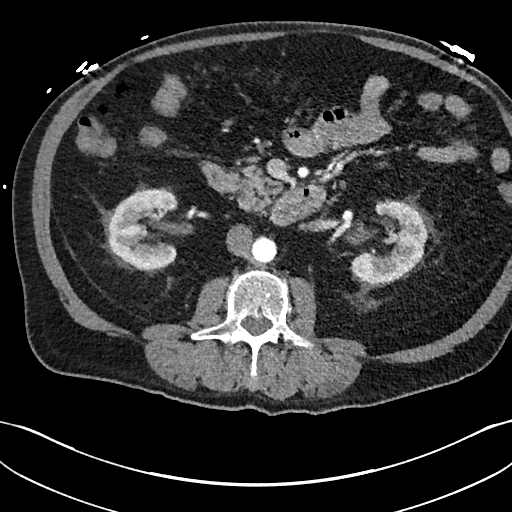
[im 123/215  soft-tissue]
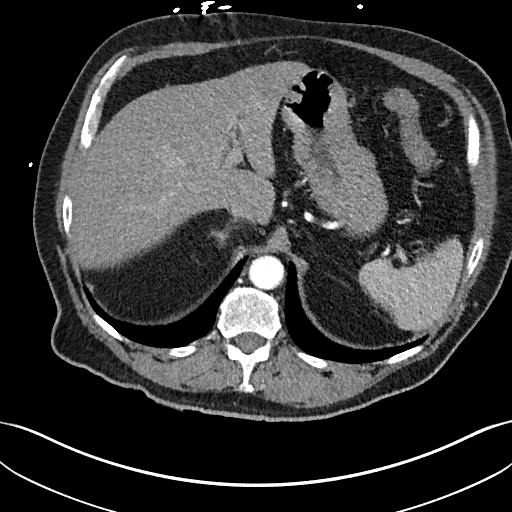
[im 138/215  soft-tissue]
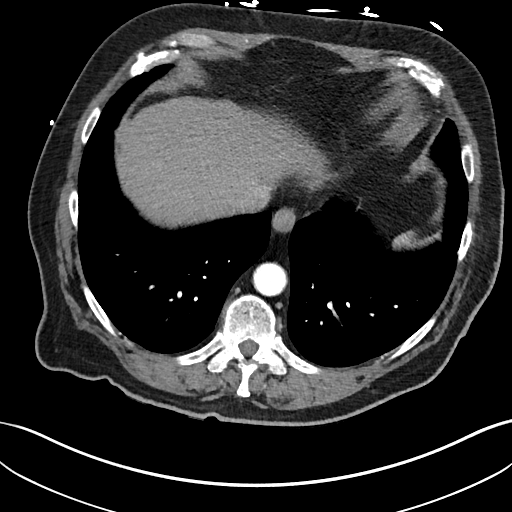
[im 153/215  soft-tissue]
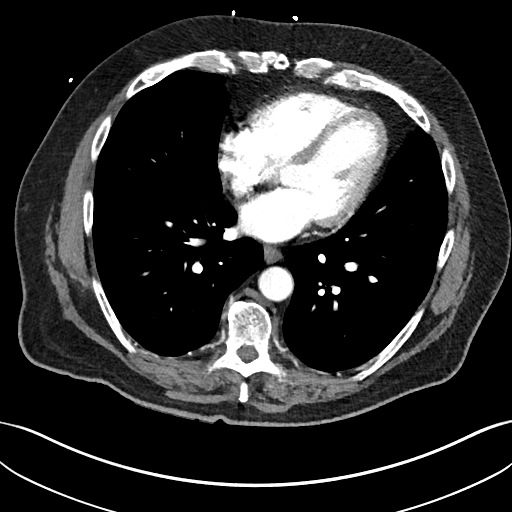
[im 184/215  soft-tissue]
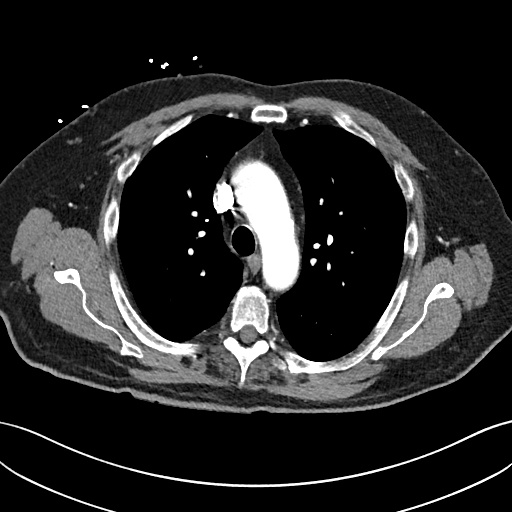
[im 184/215  bone]
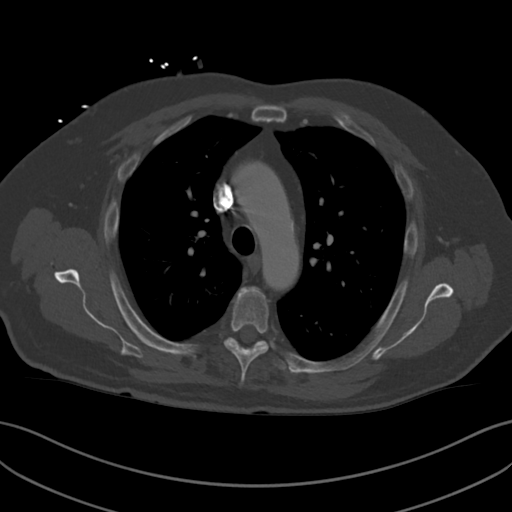
[im 199/215  soft-tissue]
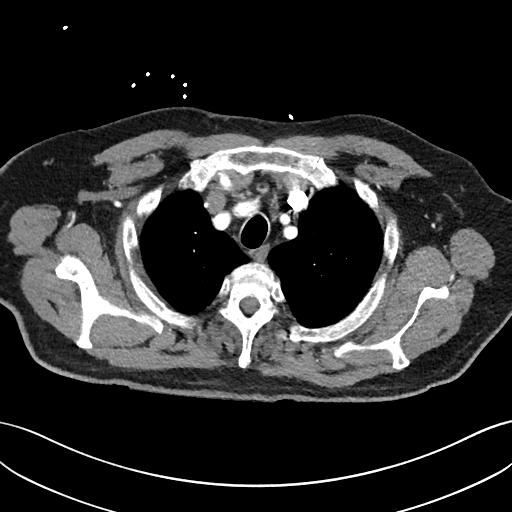

[Series 9: coronals · coronal · 0.81mm/px · 3 of 151 slices shown]
[im 38/151  soft-tissue]
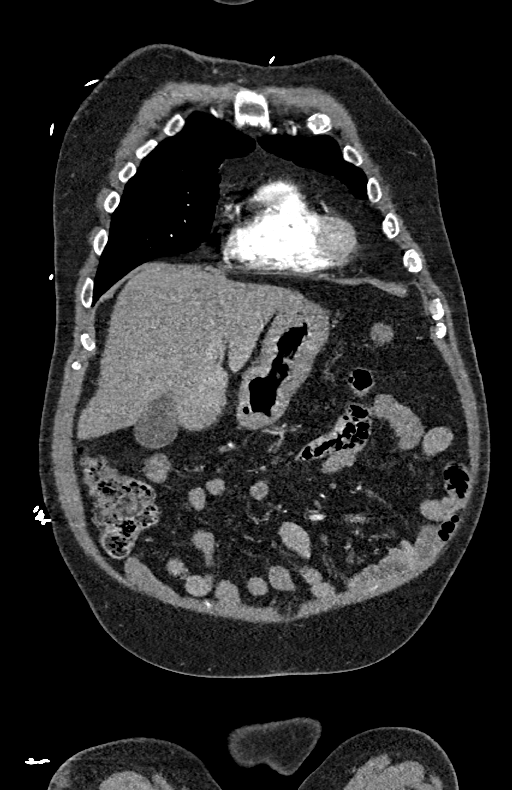
[im 76/151  soft-tissue]
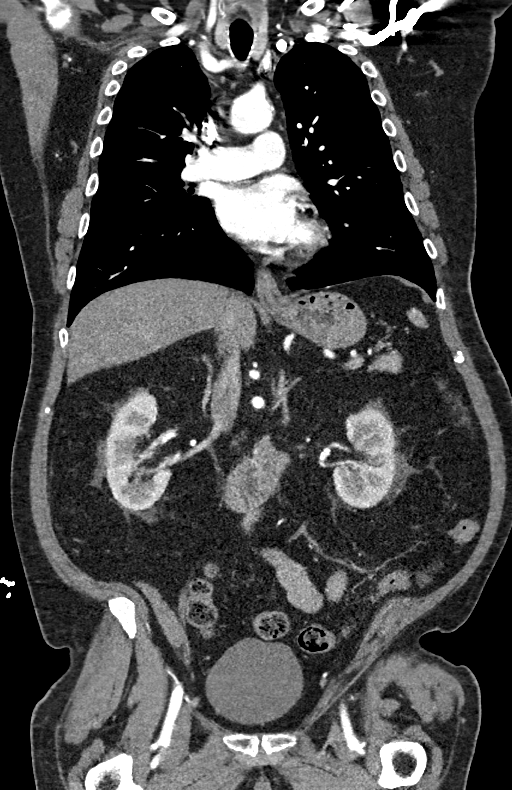
[im 113/151  soft-tissue]
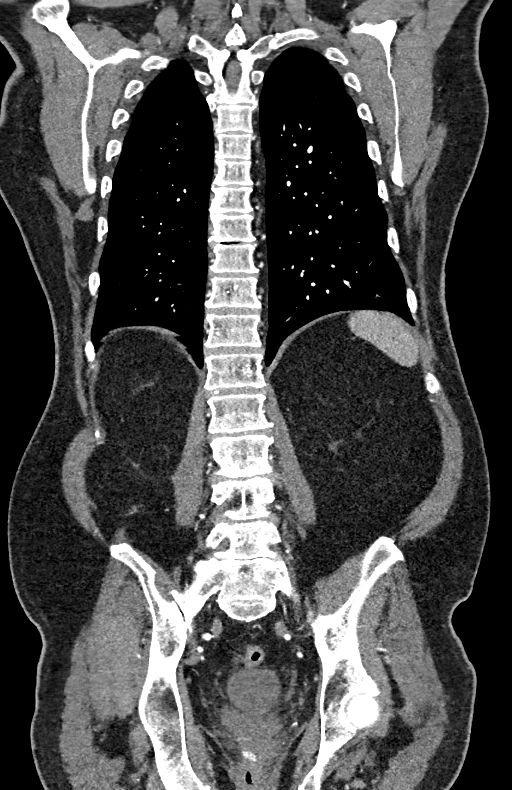

[13 of 46 positions shown; findings below may reference images not displayed]

Multidetector CT imaging through the chest, abdomen and pelvis was
performed using the standard protocol during bolus administration of
intravenous contrast. Multiplanar reconstructed images and MIPs were
obtained and reviewed to evaluate the vascular anatomy.

RADIATION DOSE REDUCTION: This exam was performed according to the
departmental dose-optimization program which includes automated
exposure control, adjustment of the mA and/or kV according to
patient size and/or use of iterative reconstruction technique.

CONTRAST:  100mL OMNIPAQUE IOHEXOL 350 MG/ML SOLN
FINDINGS: CTA CHEST FINDINGS

Cardiovascular: Noncontrast sequence demonstrates aortic
atherosclerosis and coronary artery calcifications without
intramural thoracic hematoma. Preferential opacification of the
thoracic aorta postcontrast administration. Ascending thoracic
aortic aneurysm measures 4.3 cm previously 4.1 cm, with differences
in measurement at least somewhat related to increased motion on this
examination. Normal heart size. No pericardial effusion.

Mediastinum/Nodes: No enlarged mediastinal, hilar, or axillary lymph
nodes. Thyroid gland, trachea, and esophagus demonstrate no
significant findings.

Lungs/Pleura: Unchanged size of the 4 mm left upper lobe pulmonary
nodule on image [DATE]. No new suspicious pulmonary nodules or masses.
No focal airspace consolidation. No pleural effusion. No
pneumothorax

Musculoskeletal: No acute osseous abnormality.

Review of the MIP images confirms the above findings.

CTA ABDOMEN AND PELVIS FINDINGS

VASCULAR

Aorta: Aortic atherosclerosis. Normal caliber aorta without
aneurysm, dissection, vasculitis or significant stenosis.

Celiac: Patent without evidence of aneurysm, dissection, vasculitis
or significant stenosis.

SMA: Patent without evidence of aneurysm, dissection, vasculitis or
significant stenosis.

Renals: Renal arteries are patent without evidence of aneurysm,
dissection, vasculitis, fibromuscular dysplasia or significant
stenosis.

IMA: Patent without evidence of aneurysm, dissection, vasculitis or
significant stenosis.

Inflow: Patent without evidence of aneurysm, dissection, vasculitis
or significant stenosis.

Veins: No obvious venous abnormality within the limitations of this
arterial phase study.

Review of the MIP images confirms the above findings.

NON-VASCULAR

Hepatobiliary: No focal liver abnormality is seen. No gallstones,
gallbladder wall thickening, or biliary dilatation.

Pancreas: Unremarkable. No pancreatic ductal dilatation or
surrounding inflammatory changes.

Spleen: Normal in size without focal abnormality.

Adrenals/Urinary Tract: Adrenal glands are unremarkable. Kidneys are
normal, without renal calculi, focal lesion, or hydronephrosis.
Bladder is unremarkable.

Stomach/Bowel: Stomach is within normal limits. Multiple
appendicoliths within a nondilated noninflamed appendix. No
evidence of bowel wall thickening, distention, or inflammatory
changes.

Lymphatic: No pathologically enlarged abdominal or pelvic lymph
nodes.

Reproductive: Brachytherapy seeds in the prostate gland.

Other: No significant abdominopelvic free fluid.

Musculoskeletal: L1 vertebral body hemangioma. No acute osseous
abnormality. Multilevel degenerative changes spine.

Review of the MIP images confirms the above findings.
IMPRESSION: 1. Ascending thoracic aortic aneurysm measures up to 4.3 cm,
previously 4.1 cm, with differences in measurement at least somewhat
related to increased motion on this examination. Recommend annual
imaging followup by CTA or MRA. This recommendation follows 0070
ACCF/AHA/AATS/ACR/ASA/SCA/KATALINO/GODME/JIM/ASHIDA Guidelines for the
Diagnosis and Management of Patients with Thoracic Aortic Disease.
Circulation. 0070; 121: [id number]. Aortic aneurysm NOS ([id number].R)
2. No acute process in the chest, abdomen or pelvis.
3. Stable left upper lobe pulmonary nodule again suggest follow-up
imaging in 1 year from prior chest CT.
4.  Aortic Atherosclerosis ([id number]-AR6.6).

## 2024-03-09 MED ORDER — IOPAMIDOL (ISOVUE-370) INJECTION 76%
75.0000 mL | Freq: Once | INTRAVENOUS | Status: AC | PRN
  Administered 2024-03-09: 75 mL via INTRAVENOUS

## 2024-03-14 NOTE — Progress Notes (Unsigned)
 HPI: [name] is a 73 year old gentleman with a past medical history notable for hypertension, dyslipidemia, type 2 diabetes mellitus, prostate cancer, and neuropathy.  He was referred to [place] few years ago for surveillance of a 4.2 cm thoracic aortic aneurysm that was picked up on a low-dose cancer screening chest CT scan in 2022.  It has been stable thus far.  He returns today for scheduled annual follow-up after a CTA chest on 03/09/2024.   Current Outpatient Medications  Medication Sig Dispense Refill   aspirin 81 MG chewable tablet Chew 81 mg by mouth daily.     Biotin 5 MG CAPS Take 1 capsule by mouth daily.     clotrimazole (LOTRIMIN) 1 % cream Apply 1 application. topically as needed (yeast infection).     glucose blood (FREESTYLE LITE) test strip USE TEST STRIPS TO TEST EVERY DAY     Insulin Glargine (LANTUS) 100 UNIT/ML Solostar Pen INJECT 20 UNITS SUBCUTANEOUSLY EVERY DAY (Patient taking differently: Inject 24 Units into the skin daily.) 15 mL 3   Insulin Pen Needle (PEN NEEDLES) 31G X 5 MM MISC 2 each by Does not apply route daily. 100 each 6   JARDIANCE 25 MG TABS tablet Take 25 mg by mouth daily.     liraglutide (VICTOZA) 18 MG/3ML SOPN Inject 0.3 mLs (1.8 mg total) into the skin daily. 9 mL 3   lisinopril (PRINIVIL,ZESTRIL) 10 MG tablet Take 1 tablet (10 mg total) by mouth daily. 90 tablet 3   metFORMIN (GLUCOPHAGE-XR) 500 MG 24 hr tablet Take 4 tablets (2,000 mg total) by mouth daily with breakfast. 360 tablet 0   omeprazole (PRILOSEC) 40 MG capsule Take 1 capsule (40 mg total) by mouth 2 (two) times daily. (Patient taking differently: Take 40 mg by mouth daily.) 180 capsule 3   ondansetron (ZOFRAN) 4 MG tablet Take 1 tablet (4 mg total) by mouth every 8 (eight) hours as needed for nausea or vomiting. 12 tablet 0   ondansetron (ZOFRAN) 4 MG tablet Take 1 tablet (4 mg total) by mouth every 8 (eight) hours as needed for nausea or vomiting. 12 tablet 0   rosuvastatin (CRESTOR) 5  MG tablet Take 1 tablet (5 mg total) by mouth daily. 90 tablet 3   tamsulosin (FLOMAX) 0.4 MG CAPS capsule Take 1 capsule (0.4 mg total) by mouth daily. 90 capsule 4   No current facility-administered medications for this visit.    Physical Exam  Vital signs BP 118/76 Pulse 87 Respirations 18 SpO2 94% on room air  General: Pleasant 73 year old male in no distress. Heart: Regular rate and rhythm, no murmur Chest: Symmetrical, breath sounds CTA Extremities: No peripheral edema   Diagnostic Tests:  CLINICAL DATA:  Aneurysm suspected.   EXAM: CT ANGIOGRAPHY CHEST WITH CONTRAST   TECHNIQUE: Multidetector CT imaging of the chest was performed using the standard protocol during bolus administration of intravenous contrast. Multiplanar CT image reconstructions and MIPs were obtained to evaluate the vascular anatomy.   RADIATION DOSE REDUCTION: This exam was performed according to the departmental dose-optimization program which includes automated exposure control, adjustment of the mA and/or kV according to patient size and/or use of iterative reconstruction technique.   CONTRAST:  75mL ISOVUE-370 IOPAMIDOL (ISOVUE-370) INJECTION 76%   COMPARISON:  Chest CT 02/19/2023   FINDINGS: Cardiovascular: Ascending aorta is dilated measuring 4.2 cm, unchanged. There is no evidence for aortic dissection. Origin of the great vessels is within normal limits. There is no central pulmonary embolism. Heart is normal  in size. There is no pericardial effusion.   Mediastinum/Nodes: No enlarged mediastinal, hilar, or axillary lymph nodes. Thyroid gland, trachea, and esophagus demonstrate no significant findings.   Lungs/Pleura: Lungs are clear. No pleural effusion or pneumothorax.   Upper Abdomen: No acute abnormality.   Musculoskeletal: No chest wall abnormality. No acute or significant osseous findings.   Review of the MIP images confirms the above findings.   IMPRESSION: Stable  dilatation of the ascending aorta measuring 4.2 cm. No acute cardiopulmonary process. Recommend annual imaging followup by CTA or MRA. This recommendation follows 2010 ACCF/AHA/AATS/ACR/ASA/SCA/SCAI/SIR/STS/SVM Guidelines for the Diagnosis and Management of Patients with Thoracic Aortic Disease. Circulation. 2010; 121: [id number]. Aortic aneurysm NOS (ICD10-I71.9)     Electronically Signed   By: Darliss Cheney M.D.   On: 03/09/2024 17:33    Impression / Plan: Stable 4.2 cm thoracic aortic aneurysm with no complicating features.  No surgical intervention is indicated. We again discussed the importance of ongoing surveillance, careful blood pressure control, and the recommendations for avoiding strenuous activity and fluoroquinolone antibiotics. Follow-up in 1 year with CTA chest.  Leary Roca, PA-C [facility] [phone]

## 2024-03-16 ENCOUNTER — Encounter: Payer: Self-pay | Admitting: Physician Assistant

## 2024-03-16 ENCOUNTER — Ambulatory Visit: Payer: PPO | Admitting: Physician Assistant

## 2024-03-16 VITALS — BP 118/76 | HR 87 | Resp 18 | Ht 70.0 in | Wt 203.0 lb

## 2024-03-16 DIAGNOSIS — I7121 Aneurysm of the ascending aorta, without rupture: Secondary | ICD-10-CM | POA: Diagnosis not present

## 2024-03-16 NOTE — Patient Instructions (Signed)
 Risk Modification in those with ascending thoracic aortic aneurysm:  Continue good control of blood pressure (prefer SBP 130/80 or less) 2. Avoid fluoroquinolone antibiotics (I.e Ciprofloxacin, Avelox, Levofloxacin, Ofloxacin)  3.  Continue se of statin (to decrease cardiovascular risk)  4.  Exercise and activity limitations is individualized, but in general, contact sports are to be avoided and one should avoid heavy lifting (defined as half of ideal body weight) and exercises involving sustained Valsalva maneuver.  5.  Follow-up 1 year with CTA chest

## 2024-11-02 ENCOUNTER — Other Ambulatory Visit: Payer: Self-pay | Admitting: Medical Genetics

## 2024-11-02 DIAGNOSIS — Z006 Encounter for examination for normal comparison and control in clinical research program: Secondary | ICD-10-CM

## 2024-12-10 LAB — GENECONNECT MOLECULAR SCREEN: Genetic Analysis Overall Interpretation: NEGATIVE
